# Patient Record
Sex: Male | Born: 1945 | Race: White | Hispanic: No | Marital: Married | Smoking: Former smoker
Health system: Southern US, Community
[De-identification: ages and names within clinical notes are randomized; demographics above are authoritative.]

## PROBLEM LIST (undated history)

## (undated) DIAGNOSIS — B029 Zoster without complications: Secondary | ICD-10-CM

## (undated) HISTORY — PX: VASECTOMY: SHX75

## (undated) HISTORY — DX: Zoster without complications: B02.9

---

## 2015-03-05 ENCOUNTER — Other Ambulatory Visit (HOSPITAL_BASED_OUTPATIENT_CLINIC_OR_DEPARTMENT_OTHER): Payer: Medicare HMO

## 2015-03-05 ENCOUNTER — Other Ambulatory Visit: Payer: Medicare HMO

## 2015-03-05 ENCOUNTER — Ambulatory Visit (HOSPITAL_BASED_OUTPATIENT_CLINIC_OR_DEPARTMENT_OTHER): Payer: Medicare HMO | Admitting: Hematology & Oncology

## 2015-03-05 ENCOUNTER — Ambulatory Visit: Payer: Medicare HMO

## 2015-03-05 ENCOUNTER — Ambulatory Visit: Payer: Medicare HMO | Admitting: Hematology & Oncology

## 2015-03-05 ENCOUNTER — Encounter: Payer: Self-pay | Admitting: Hematology & Oncology

## 2015-03-05 VITALS — BP 123/67 | HR 62 | Temp 98.1°F | Resp 18 | Ht 68.0 in | Wt 173.0 lb

## 2015-03-05 DIAGNOSIS — D7589 Other specified diseases of blood and blood-forming organs: Secondary | ICD-10-CM

## 2015-03-05 DIAGNOSIS — D539 Nutritional anemia, unspecified: Secondary | ICD-10-CM

## 2015-03-05 DIAGNOSIS — K8020 Calculus of gallbladder without cholecystitis without obstruction: Secondary | ICD-10-CM

## 2015-03-05 LAB — CBC WITH DIFFERENTIAL (CANCER CENTER ONLY)
BASO#: 0.1 10*3/uL (ref 0.0–0.2)
BASO%: 1.4 % (ref 0.0–2.0)
EOS%: 1.5 % (ref 0.0–7.0)
Eosinophils Absolute: 0.1 10*3/uL (ref 0.0–0.5)
HEMATOCRIT: 34.5 % — AB (ref 38.7–49.9)
HEMOGLOBIN: 11.4 g/dL — AB (ref 13.0–17.1)
LYMPH#: 2.4 10*3/uL (ref 0.9–3.3)
LYMPH%: 33.1 % (ref 14.0–48.0)
MCH: 37.7 pg — ABNORMAL HIGH (ref 28.0–33.4)
MCHC: 33 g/dL (ref 32.0–35.9)
MCV: 114 fL — ABNORMAL HIGH (ref 82–98)
MONO#: 0.5 10*3/uL (ref 0.1–0.9)
MONO%: 6.8 % (ref 0.0–13.0)
NEUT%: 57.2 % (ref 40.0–80.0)
NEUTROS ABS: 4.1 10*3/uL (ref 1.5–6.5)
Platelets: 213 10*3/uL (ref 145–400)
RBC: 3.02 10*6/uL — ABNORMAL LOW (ref 4.20–5.70)
RDW: 10.4 % — ABNORMAL LOW (ref 11.1–15.7)
WBC: 7.2 10*3/uL (ref 4.0–10.0)

## 2015-03-05 LAB — CHCC SATELLITE - SMEAR

## 2015-03-05 NOTE — Progress Notes (Signed)
Referral MD  Reason for Referral: Macrocytic anemia   Chief Complaint  Patient presents with  . OTHER    New Patient  : I want to make sure that I do not have Dothan's Disease  HPI: Duane Clarke is a very nice 69 year old white male. He is originally from [place]. He is residual from [facility].  He says that his brother died after having the flu. He says brother had a blood problem. He said that his brother had a have his spleen taken out area did after he had the spleen taken out, he came down the flu and died.  I have never heard of Dothan disease. I have looked this up. I'm not sure at all as to what exactly his brother had.  Duane Clarke has been feeling well. He works. He's had no fatigue or weakness. He's had no obvious bleeding. He's had no abdominal pain. He's had no cough. He's had no change in bowel or bladder habits.  His wife was worried about him having Dothan disease. As such, he is being referred to [place] for an evaluation.  He's had no issues with fever. He's had no infection process.  He does not know if he has any gallstones. He was told that at some point, they will have gallstones.  As far as he knows, no one else his family has any obvious blood problems.  Is no history of cancer in the family.  He is up-to-date with his colonoscopy and PSA.  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  Overall, his performance status is ECOG 0.   No past medical history on file.:  No past surgical history on file.:  No current outpatient prescriptions on file.:  :  Allergies  Allergen Reactions  . Aspirin Other (See Comments)    fatigue  :  No family history on file.:  Social History   Social History  . Marital Status: Married    Spouse Name: N/A  . Number of Children: N/A  . Years of Education: N/A   Occupational History  . Not on file.   Social History Main Topics  . Smoking status:  Former Games developer  . Smokeless tobacco: Not on file     Comment: Quit in 1977  . Alcohol Use: Not on file  . Drug Use: Not on file  . Sexual Activity: Not on file   Other Topics Concern  . Not on file   Social History Narrative  . No narrative on file  :  Pertinent items are noted in HPI.  Exam: @  well-developed and well-nourished white male in no obvious distress. Vital signs show a temperature of 98.1. Pulse 62. Blood pressure 123/67. Weight is 173 pounds. Head and neck exam shows no ocular or oral lesions. He does have a subconjunctival hemorrhage in the left eye. His pupils react properly. There is no adenopathy in the neck. Lungs are clear bilaterally. Cardiac exam regular rate and rhythm with a normal S1 and S2. There are no murmurs, rubs or bruits. Abdomen is soft. He has good bowel sounds. There is no fluid wave. There is no obvious hepatomegaly. Spleen tip might be palpable with deep inspiration. Back exam shows no tenderness over the spine, ribs or hips. Extremities shows no clubbing, cyanosis or edema. Neurological exam shows no focal neurological deficit. Skin exam shows no rashes, ecchymoses or petechia.    Recent Labs  03/05/15 1529  WBC 7.2  HGB 11.4*  HCT 34.5*  PLT 213   No results for input(s): NA, K, CL, CO2, GLUCOSE, BUN, CREATININE, CALCIUM in the last 72 hours.  Blood smear review: Slightly macrocytic population of red blood cells. There are some spherocytes. I see no nucleated red blood cells. He has no polychromasia. There is no target cells. He has no schistocytes. I see no inclusion bodies. White cells appear normal in morphology maturation. I do not see any immature myeloid or lymphoid forms. He has no hypersegmented polys. Platelets are adequate in number and size. He has well granulated platelets  Pathology: None     Assessment and Plan: Duane Clarke is a 69 year old gentleman. He has mild macrocytic anemia. His blood smear might be consistent with  spherocytosis. I just wonder if this is not what he has. I'm sending off a reticulocyte count on him.  I think that he probably needs to have a ultrasound of his abdomen. This will used IDS whether he does have gallstones and whether there is any splenomegaly. I think this would be important.  I would not think that this is vitamin B-12 deficiency. I really don't see any on the smear that looks like pernicious anemia.  I think that he probably does need folic acid. I will call in a prescription of folic acid to his pharmacy.  I spent about 45 minutes with him. Again, I have never heard of Dothan's disease. I have looked everywhere and cannot find any reference to Dothan's disease.  I would like to see him back in about 2 or 3 months. We will check his blood count then.  The other possibility that I thought about his myelodysplasia. This certainly would be more likely. He typically find macrocytic anemia with myelodysplasia. However, a bone marrow would be needed to make a diagnosis. I do not believe that this is indicated right now.  Again, I did a very nice time with him. He is very fun to talk to.

## 2015-03-06 LAB — RETICULOCYTES (CHCC)
ABS RETIC: 135.1 10*3/uL (ref 19.0–186.0)
RBC.: 3.07 MIL/uL — ABNORMAL LOW (ref 4.22–5.81)
RETIC CT PCT: 4.4 % — AB (ref 0.4–2.3)

## 2015-03-06 LAB — FOLATE RBC: RBC Folate: 1330 ng/mL (ref >280)

## 2015-03-06 LAB — VITAMIN B12: VITAMIN B 12: 444 pg/mL (ref 211–911)

## 2015-03-06 MED ORDER — FOLIC ACID 1 MG PO TABS: 1.0000 mg | ORAL_TABLET | Freq: Every day | ORAL | Status: DC

## 2015-03-06 NOTE — Addendum Note (Signed)
Addended by: Arlan Organ R on: 03/06/2015 08:57 AM   Modules accepted: Orders

## 2015-03-12 ENCOUNTER — Ambulatory Visit (HOSPITAL_BASED_OUTPATIENT_CLINIC_OR_DEPARTMENT_OTHER)
Admission: RE | Admit: 2015-03-12 | Discharge: 2015-03-12 | Disposition: A | Discharge status: Home / Self Care | Payer: Medicare HMO | Source: Ambulatory Visit | Attending: Hematology & Oncology | Admitting: Hematology & Oncology

## 2015-03-12 ENCOUNTER — Telehealth: Payer: Self-pay | Admitting: *Deleted

## 2015-03-12 DIAGNOSIS — K8020 Calculus of gallbladder without cholecystitis without obstruction: Secondary | ICD-10-CM | POA: Diagnosis not present

## 2015-03-12 DIAGNOSIS — D7589 Other specified diseases of blood and blood-forming organs: Secondary | ICD-10-CM

## 2015-03-12 DIAGNOSIS — D589 Hereditary hemolytic anemia, unspecified: Secondary | ICD-10-CM | POA: Diagnosis present

## 2015-03-12 NOTE — Telephone Encounter (Addendum)
Patient aware of results. Results sent to his PCP  ----- Message from Josph Macho, MD sent at 03/12/2015 11:17 AM EDT ----- Please call and tell him that the spleen is okay. He does have some gallstones. Thank you

## 2015-05-09 ENCOUNTER — Ambulatory Visit: Payer: Medicare HMO | Admitting: Hematology & Oncology

## 2015-05-09 ENCOUNTER — Other Ambulatory Visit: Payer: Medicare HMO

## 2016-02-23 ENCOUNTER — Other Ambulatory Visit: Payer: Self-pay | Admitting: Hematology & Oncology

## 2016-02-23 DIAGNOSIS — K8020 Calculus of gallbladder without cholecystitis without obstruction: Secondary | ICD-10-CM

## 2016-02-23 DIAGNOSIS — D7589 Other specified diseases of blood and blood-forming organs: Secondary | ICD-10-CM

## 2018-04-17 ENCOUNTER — Ambulatory Visit (INDEPENDENT_AMBULATORY_CARE_PROVIDER_SITE_OTHER): Payer: Medicare HMO | Admitting: Sports Medicine

## 2018-04-17 ENCOUNTER — Encounter: Payer: Self-pay | Admitting: Sports Medicine

## 2018-04-17 DIAGNOSIS — N400 Enlarged prostate without lower urinary tract symptoms: Secondary | ICD-10-CM | POA: Insufficient documentation

## 2018-04-17 DIAGNOSIS — Z0000 Encounter for general adult medical examination without abnormal findings: Secondary | ICD-10-CM | POA: Diagnosis not present

## 2018-04-17 DIAGNOSIS — N401 Enlarged prostate with lower urinary tract symptoms: Secondary | ICD-10-CM | POA: Diagnosis not present

## 2018-04-17 DIAGNOSIS — D531 Other megaloblastic anemias, not elsewhere classified: Secondary | ICD-10-CM

## 2018-04-17 DIAGNOSIS — R718 Other abnormality of red blood cells: Secondary | ICD-10-CM

## 2018-04-17 DIAGNOSIS — Z8619 Personal history of other infectious and parasitic diseases: Secondary | ICD-10-CM | POA: Diagnosis not present

## 2018-04-17 DIAGNOSIS — D581 Hereditary elliptocytosis: Secondary | ICD-10-CM

## 2018-04-17 MED ORDER — VALACYCLOVIR HCL 1 G PO TABS: 1000.0000 mg | ORAL_TABLET | Freq: Two times a day (BID) | ORAL | 2 refills | Status: DC

## 2018-04-17 NOTE — Assessment & Plan Note (Signed)
Adding a PSA. He does have a single episode of nocturia, weak stream and dribbling in the morning but not bad enough to consider medication.

## 2018-04-17 NOTE — Progress Notes (Addendum)
Subjective:    CC: New patient visit with the below complaints as noted in HPI:  HPI:  Sharl Ma is a pleasant 72 year old male, he is healthy, takes no medications with the exception of vitamins.  He declines all vaccines, understands the risks.  He is agreeable to have routine labs checked.  I reviewed the past medical history, family history, social history, surgical history, and allergies today and no changes were needed.  Please see the problem list section below in epic for further details.  Past Medical History: Past Medical History:  Diagnosis Date  . Shingles    Past Surgical History: Past Surgical History:  Procedure Laterality Date  . VASECTOMY     Social History: Social History   Socioeconomic History  . Marital status: Married    Spouse name: Not on file  . Number of children: Not on file  . Years of education: Not on file  . Highest education level: Not on file  Occupational History  . Not on file  Social Needs  . Financial resource strain: Not on file  . Food insecurity:    Worry: Not on file    Inability: Not on file  . Transportation needs:    Medical: Not on file    Non-medical: Not on file  Tobacco Use  . Smoking status: Former Games developer  . Smokeless tobacco: Never Used  . Tobacco comment: Quit in 1977  Substance and Sexual Activity  . Alcohol use: Never    Alcohol/week: 0.0 standard drinks    Frequency: Never  . Drug use: Never  . Sexual activity: Not on file  Lifestyle  . Physical activity:    Days per week: Not on file    Minutes per session: Not on file  . Stress: Not on file  Relationships  . Social connections:    Talks on phone: Not on file    Gets together: Not on file    Attends religious service: Not on file    Active member of club or organization: Not on file    Attends meetings of clubs or organizations: Not on file    Relationship status: Not on file  Other Topics Concern  . Not on file  Social History Narrative  . Not on  file   Family History: Family History  Problem Relation Age of Onset  . Cancer Father        prostate  . Alzheimer's disease Father   . Stroke Maternal Grandfather   . Stroke Paternal Grandfather    Allergies: Allergies  Allergen Reactions  . Aspirin Other (See Comments)    fatigue   Medications: See med rec.  Review of Systems: No headache, visual changes, nausea, vomiting, diarrhea, constipation, dizziness, abdominal pain, skin rash, fevers, chills, night sweats, swollen lymph nodes, weight loss, chest pain, body aches, joint swelling, muscle aches, shortness of breath, mood changes, visual or auditory hallucinations.  Objective:    General: Well Developed, well nourished, and in no acute distress.  Neuro: Alert and oriented x3, extra-ocular muscles intact, sensation grossly intact.  HEENT: Normocephalic, atraumatic, pupils equal round reactive to light, neck supple, no masses, no lymphadenopathy, thyroid nonpalpable.  Skin: Warm and dry, no rashes noted.  Cardiac: Regular rate and rhythm, no murmurs rubs or gallops.  Respiratory: Clear to auscultation bilaterally. Not using accessory muscles, speaking in full sentences.  Abdominal: Soft, nontender, nondistended, positive bowel sounds, no masses, no organomegaly.  Musculoskeletal: Shoulder, elbow, wrist, hip, knee, ankle stable, and with full range  of motion.  Impression and Recommendations:    The patient was counselled, risk factors were discussed, anticipatory guidance given.  BPH (benign prostatic hyperplasia) Adding a PSA. He does have a single episode of nocturia, weak stream and dribbling in the morning but not bad enough to consider medication.  Annual physical exam Adding hepatitis C screening. Declines all vaccines.  Risks, benefits explained.  Red blood cell abnormality Tells me he had a history of jaundice and abnormally shaped red blood cells, does not have any more information. Adding a CBC,  coags. Hemoglobin electrophoresis as well as a pathology smear. He also tells me that occasionally he will get jaundice when eating fatty foods, suggesting biliary obstruction. If this happens again he will come see me immediately.  History of shingles Declines all vaccines. I am going to give him some Valtrex should he develop symptoms of shingles.  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   Increased reticulocyte count, this indicates that there has been blood loss in the bone marrow is producing more red blood cells.   His elevated MCV can be explained by the reticulocytosis. Vitamin B12 is in the low limit of the normal range, I would like him to start oral vitamin B12 supplementation. I will call this in, 1000 mcg daily. In 1 month I would like to recheck his hemoglobin, anemia panels, as well as B12 levels. In addition because he is likely losing blood somewhere I would like him to touch base with gastroenterology. ___________________________________________ [name]. Benjamin Stain, M.D., ABFM., CAQSM. Primary Care and Sports Medicine [facility]  Adjunct Professor of Family Medicine  [employer]

## 2018-04-17 NOTE — Assessment & Plan Note (Signed)
Declines all vaccines. I am going to give him some Valtrex should he develop symptoms of shingles.

## 2018-04-17 NOTE — Assessment & Plan Note (Signed)
Tells me he had a history of jaundice and abnormally shaped red blood cells, does not have any more information. Adding a CBC, coags. Hemoglobin electrophoresis as well as a pathology smear. He also tells me that occasionally he will get jaundice when eating fatty foods, suggesting biliary obstruction. If this happens again he will come see me immediately.

## 2018-04-17 NOTE — Assessment & Plan Note (Addendum)
Adding hepatitis C screening. Declines all vaccines.  Risks, benefits explained.

## 2018-04-18 DIAGNOSIS — D531 Other megaloblastic anemias, not elsewhere classified: Secondary | ICD-10-CM | POA: Insufficient documentation

## 2018-04-18 LAB — CBC
HCT: 36 % — ABNORMAL LOW (ref 38.5–50.0)
Hemoglobin: 12.6 g/dL — ABNORMAL LOW (ref 13.2–17.1)
MCH: 37.5 pg — ABNORMAL HIGH (ref 27.0–33.0)
MCHC: 35 g/dL (ref 32.0–36.0)
MCV: 107.1 fL — ABNORMAL HIGH (ref 80.0–100.0)
MPV: 12.2 fL (ref 7.5–12.5)
Platelets: 236 10*3/uL (ref 140–400)
RBC: 3.36 Million/uL — ABNORMAL LOW (ref 4.20–5.80)
RDW: 10.3 % — ABNORMAL LOW (ref 11.0–15.0)
WBC: 8.4 10*3/uL (ref 3.8–10.8)

## 2018-04-18 NOTE — Assessment & Plan Note (Addendum)
Anemic with a very elevated MCV, has been high as 114 in the past as well, adding an anemia panel, please call lab to have these orders added.   Increased reticulocyte count, this indicates that there has been blood loss in the bone marrow is producing more red blood cells.   His elevated MCV can be explained by the reticulocytosis. Vitamin B12 is in the low limit of the normal range, I would like him to start oral vitamin B12 supplementation. I will call this in, 1000 mcg daily. In 1 month I would like to recheck his hemoglobin, anemia panels, as well as B12 levels. In addition because he is likely losing blood somewhere I would like him to touch base with gastroenterology.

## 2018-04-18 NOTE — Addendum Note (Signed)
Addended by: Monica Becton on: 04/18/2018 08:31 AM   Modules accepted: Orders

## 2018-04-20 MED ORDER — VITAMIN B-12 1000 MCG PO TABS: 1000.0000 ug | ORAL_TABLET | Freq: Every day | ORAL | 11 refills | Status: AC

## 2018-04-20 NOTE — Addendum Note (Signed)
Addended by: Monica Becton on: 04/20/2018 05:28 PM   Modules accepted: Orders

## 2018-04-21 LAB — ANEMIA PROFILE B
%SAT: 39 % (ref 20–48)
%SAT: 52 % (calc) — ABNORMAL HIGH (ref 20–48)
ABS Retic: 123950 cells/uL — ABNORMAL HIGH (ref 25000–9000)
ABS Retic: 144000 cells/uL — ABNORMAL HIGH (ref 25000–9000)
Basophils Absolute: 134 {cells}/uL (ref 0–200)
Basophils Absolute: 109 cells/uL (ref 0–200)
Basophils Relative: 1.6 %
Basophils Relative: 1.6 %
Eosinophils Absolute: 101 cells/uL (ref 15–500)
Eosinophils Absolute: 82 {cells}/uL (ref 15–500)
Eosinophils Relative: 1.2 %
Eosinophils Relative: 1.2 %
Ferritin: 328 ng/mL (ref 24–380)
Ferritin: 276 ng/mL (ref 24–380)
Folate: 16.4 ng/mL
Folate: 21.5 ng/mL
HCT: 36 % — ABNORMAL LOW (ref 38.5–50.0)
HCT: 34.8 % — ABNORMAL LOW (ref 38.5–50.0)
Hemoglobin: 12.6 g/dL — ABNORMAL LOW (ref 13.2–17.1)
Hemoglobin: 12 g/dL — ABNORMAL LOW (ref 13.2–17.1)
Iron: 119 ug/dL (ref 50–180)
Iron: 156 ug/dL (ref 50–180)
Lymphs Abs: 3503 {cells}/uL (ref 850–3900)
Lymphs Abs: 2482 cells/uL (ref 850–3900)
MCH: 37.5 pg — ABNORMAL HIGH (ref 27.0–33.0)
MCH: 37.5 pg — ABNORMAL HIGH (ref 27.0–33.0)
MCHC: 35 g/dL (ref 32.0–36.0)
MCHC: 34.5 g/dL (ref 32.0–36.0)
MCV: 107.1 fL — ABNORMAL HIGH (ref 80.0–100.0)
MCV: 108.8 fL — ABNORMAL HIGH (ref 80.0–100.0)
MPV: 12.2 fL (ref 7.5–12.5)
MPV: 12.2 fL (ref 7.5–12.5)
Monocytes Relative: 6.3 %
Monocytes Relative: 4.3 %
Neutro Abs: 4133 cells/uL (ref 1500–7800)
Neutro Abs: 3835 cells/uL (ref 1500–7800)
Neutrophils Relative %: 49.2 %
Neutrophils Relative %: 56.4 %
Platelets: 236 Thousand/uL (ref 140–400)
Platelets: 226 Thousand/uL (ref 140–400)
RBC: 3.36 10*6/uL — ABNORMAL LOW (ref 4.20–5.80)
RBC: 3.2 Million/uL — ABNORMAL LOW (ref 4.20–5.80)
RDW: 10.3 % — ABNORMAL LOW (ref 11.0–15.0)
RDW: 9.9 % — ABNORMAL LOW (ref 11.0–15.0)
Retic Ct Pct: 3.7 %
Retic Ct Pct: 4.5 %
TIBC: 307 mcg/dL (calc) (ref 250–425)
TIBC: 299 mcg/dL (calc) (ref 250–425)
Total Lymphocyte: 41.7 %
Total Lymphocyte: 36.5 %
Vitamin B-12: 291 pg/mL (ref 200–1100)
Vitamin B-12: 324 pg/mL (ref 200–1100)
WBC mixed population: 529 {cells}/uL (ref 200–950)
WBC mixed population: 292 cells/uL (ref 200–950)
WBC: 8.4 10*3/uL (ref 3.8–10.8)
WBC: 6.8 10*3/uL (ref 3.8–10.8)

## 2018-04-21 LAB — TEST AUTHORIZATION 3

## 2018-04-21 LAB — TEST AUTHORIZATION

## 2018-04-21 LAB — COMPREHENSIVE METABOLIC PANEL WITH GFR
AG Ratio: 2.3 (calc) (ref 1.0–2.5)
AST: 18 U/L (ref 10–35)
CO2: 28 mmol/L (ref 20–32)
Calcium: 9.4 mg/dL (ref 8.6–10.3)
Chloride: 105 mmol/L (ref 98–110)
Globulin: 2 g/dL (ref 1.9–3.7)
Glucose, Bld: 90 mg/dL (ref 65–99)
Sodium: 140 mmol/L (ref 135–146)
Total Protein: 6.5 g/dL (ref 6.1–8.1)

## 2018-04-21 LAB — HEMOGLOBINOPATHY EVALUATION
Fetal Hemoglobin Testing: <1 % (ref 0.0–1.9)
HCT: 36.2 % — ABNORMAL LOW (ref 38.5–50.0)
Hemoglobin A2 - HGBRFX: 2.1 % (ref 1.8–3.5)
Hemoglobin: 12.4 g/dL — ABNORMAL LOW (ref 13.2–17.1)
Hgb A: 96.9 % (ref >96.0)
MCH: 37.1 pg — ABNORMAL HIGH (ref 27.0–33.0)
MCV: 108.4 fL — ABNORMAL HIGH (ref 80.0–100.0)
RBC: 3.34 Million/uL — ABNORMAL LOW (ref 4.20–5.80)
RDW: 10.1 % — ABNORMAL LOW (ref 11.0–15.0)

## 2018-04-21 LAB — PSA, TOTAL AND FREE
PSA, % Free: 33 % (calc) (ref >25)
PSA, Free: 0.1 ng/mL
PSA, Total: 0.3 ng/mL (ref <=4.0)

## 2018-04-21 LAB — LIPID PANEL W/REFLEX DIRECT LDL
Cholesterol: 149 mg/dL (ref <200)
HDL: 40 mg/dL — ABNORMAL LOW (ref >40)
LDL Cholesterol (Calc): 90 mg/dL
Non-HDL Cholesterol (Calc): 109 mg/dL (calc) (ref <130)
Total CHOL/HDL Ratio: 3.7 (calc) (ref <5.0)
Triglycerides: 94 mg/dL (ref <150)

## 2018-04-21 LAB — HEPATITIS C ANTIBODY
Hepatitis C Ab: NONREACTIVE
SIGNAL TO CUT-OFF: 0.01 (ref <1.00)

## 2018-04-21 LAB — COMPREHENSIVE METABOLIC PANEL
ALT: 16 U/L (ref 9–46)
Albumin: 4.5 g/dL (ref 3.6–5.1)
Alkaline phosphatase (APISO): 58 U/L (ref 40–115)
BUN: 13 mg/dL (ref 7–25)
Creat: 0.9 mg/dL (ref 0.70–1.18)
Potassium: 4 mmol/L (ref 3.5–5.3)
Total Bilirubin: 2.8 mg/dL — ABNORMAL HIGH (ref 0.2–1.2)

## 2018-04-21 LAB — PATHOLOGIST SMEAR REVIEW

## 2018-04-21 LAB — HEMOGLOBIN A1C: Hgb A1c MFr Bld: <4 % of total Hgb (ref <5.7)

## 2018-04-21 LAB — TSH: TSH: 1.44 m[IU]/L (ref 0.40–4.50)

## 2018-05-02 ENCOUNTER — Encounter: Payer: Self-pay | Admitting: Sports Medicine

## 2018-05-02 ENCOUNTER — Ambulatory Visit (INDEPENDENT_AMBULATORY_CARE_PROVIDER_SITE_OTHER): Payer: Medicare HMO | Admitting: Sports Medicine

## 2018-05-02 DIAGNOSIS — D531 Other megaloblastic anemias, not elsewhere classified: Secondary | ICD-10-CM | POA: Diagnosis not present

## 2018-05-02 NOTE — Assessment & Plan Note (Signed)
Megaloblastic anemia, vitamin B12 is low, we have started supplementation. 1000 micro grams daily. Return in 1 month to recheck hemoglobin and anemia indices. He has described some form of elliptocytosis which we did not see on the peripheral smear, his son will have a letter drafted to the insurance company that should lower his policy.

## 2018-05-02 NOTE — Progress Notes (Signed)
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  He just wanted to go over his lab results, he does have a life insurance policy, because of what he described as an elliptocytosis versus spherocytosis his rates are expensive, because we did a peripheral smear, and no bite cells, spherocytes or elliptocytes were seen he would like me to write a letter to his insurance company.  I reviewed the past medical history, family history, social history, surgical history, and allergies today and no changes were needed.  Please see the problem list section below in epic for further details.  Past Medical History: Past Medical History:  Diagnosis Date  . Shingles    Past Surgical History: Past Surgical History:  Procedure Laterality Date  . VASECTOMY     Social History: Social History   Socioeconomic History  . Marital status: Married    Spouse name: Not on file  . Number of children: Not on file  . Years of education: Not on file  . Highest education level: Not on file  Occupational History  . Not on file  Social Needs  . Financial resource strain: Not on file  . Food insecurity:    Worry: Not on file    Inability: Not on file  . Transportation needs:    Medical: Not on file    Non-medical: Not on file  Tobacco Use  . Smoking status: Former Games developer  . Smokeless tobacco: Never Used  . Tobacco comment: Quit in 1977  Substance and Sexual Activity  . Alcohol use: Never    Alcohol/week: 0.0 standard drinks    Frequency: Never  . Drug use: Never  . Sexual activity: Not on file  Lifestyle  . Physical activity:    Days per week: Not on file    Minutes per session: Not on file  . Stress: Not on file  Relationships  . Social connections:    Talks on phone: Not on file    Gets together: Not on  file    Attends religious service: Not on file    Active member of club or organization: Not on file    Attends meetings of clubs or organizations: Not on file    Relationship status: Not on file  Other Topics Concern  . Not on file  Social History Narrative  . Not on file   Family History: Family History  Problem Relation Age of Onset  . Cancer Father        prostate  . Alzheimer's disease Father   . Stroke Maternal Grandfather   . Stroke Paternal Grandfather    Allergies: Allergies  Allergen Reactions  . Aspirin Other (See Comments)    fatigue   Medications: See med rec.  Review of Systems: No fevers, chills, night sweats, weight loss, chest pain, or shortness of breath.   Objective:    General: Well Developed, well nourished, and in no acute distress.  Neuro: Alert and oriented x3, extra-ocular muscles intact, sensation grossly intact.  HEENT: Normocephalic, atraumatic, pupils equal round reactive to light, neck supple, no masses, no lymphadenopathy, thyroid nonpalpable.  Skin: Warm and dry, no rashes. Cardiac: Regular rate and rhythm, no murmurs rubs or gallops, no lower extremity edema.  Respiratory: Clear to auscultation bilaterally. Not using accessory muscles, speaking in  full sentences.  Impression and Recommendations:    Megaloblastic anemia Megaloblastic anemia, vitamin B12 is low, we have started supplementation. 1000 micro grams daily. Return in 1 month to recheck hemoglobin and anemia indices. He has described some form of elliptocytosis which we did not see on the peripheral smear, his son will have a letter drafted to the insurance company that should lower his policy. ___________________________________________ [name]. Benjamin Stain, M.D., ABFM., CAQSM. Primary Care and Sports Medicine [facility]  Adjunct Professor of Family Medicine  [employer]

## 2018-05-12 ENCOUNTER — Encounter: Payer: Self-pay | Admitting: Sports Medicine

## 2018-05-12 ENCOUNTER — Ambulatory Visit (INDEPENDENT_AMBULATORY_CARE_PROVIDER_SITE_OTHER): Payer: Medicare HMO | Admitting: Sports Medicine

## 2018-05-12 DIAGNOSIS — S01111D Laceration without foreign body of right eyelid and periocular area, subsequent encounter: Secondary | ICD-10-CM | POA: Diagnosis not present

## 2018-05-12 DIAGNOSIS — S0181XD Laceration without foreign body of other part of head, subsequent encounter: Secondary | ICD-10-CM | POA: Diagnosis not present

## 2018-05-12 DIAGNOSIS — S0181XA Laceration without foreign body of other part of head, initial encounter: Secondary | ICD-10-CM

## 2018-05-12 DIAGNOSIS — S01119A Laceration without foreign body of unspecified eyelid and periocular area, initial encounter: Secondary | ICD-10-CM | POA: Insufficient documentation

## 2018-05-12 NOTE — Progress Notes (Signed)
Subjective:    CC: Laceration  HPI: Duane Clarke fell up the stairs, hit his head and sustained a large laceration on his right forehead.  This was repaired in the emergency department, he is here for further evaluation.  No loss of consciousness, minimal pain, sutures have been in for 1 week now.  I reviewed the past medical history, family history, social history, surgical history, and allergies today and no changes were needed.  Please see the problem list section below in epic for further details.  Past Medical History: Past Medical History:  Diagnosis Date  . Shingles    Past Surgical History: Past Surgical History:  Procedure Laterality Date  . VASECTOMY     Social History: Social History   Socioeconomic History  . Marital status: Married    Spouse name: Not on file  . Number of children: Not on file  . Years of education: Not on file  . Highest education level: Not on file  Occupational History  . Not on file  Social Needs  . Financial resource strain: Not on file  . Food insecurity:    Worry: Not on file    Inability: Not on file  . Transportation needs:    Medical: Not on file    Non-medical: Not on file  Tobacco Use  . Smoking status: Former Games developermoker  . Smokeless tobacco: Never Used  . Tobacco comment: Quit in 1977  Substance and Sexual Activity  . Alcohol use: Never    Alcohol/week: 0.0 standard drinks    Frequency: Never  . Drug use: Never  . Sexual activity: Not on file  Lifestyle  . Physical activity:    Days per week: Not on file    Minutes per session: Not on file  . Stress: Not on file  Relationships  . Social connections:    Talks on phone: Not on file    Gets together: Not on file    Attends religious service: Not on file    Active member of club or organization: Not on file    Attends meetings of clubs or organizations: Not on file    Relationship status: Not on file  Other Topics Concern  . Not on file  Social History Narrative  . Not on  file   Family History: Family History  Problem Relation Age of Onset  . Cancer Father        prostate  . Alzheimer's disease Father   . Stroke Maternal Grandfather   . Stroke Paternal Grandfather    Allergies: Allergies  Allergen Reactions  . Aspirin Other (See Comments)    fatigue   Medications: See med rec.  Review of Systems: No fevers, chills, night sweats, weight loss, chest pain, or shortness of breath.   Objective:    General: Well Developed, well nourished, and in no acute distress.  Neuro: Alert and oriented x3, extra-ocular muscles intact, sensation grossly intact.  HEENT: Normocephalic, atraumatic, pupils equal round reactive to light, neck supple, no masses, no lymphadenopathy, thyroid nonpalpable.  Skin: Warm and dry, no rashes. Cardiac: Regular rate and rhythm, no murmurs rubs or gallops, no lower extremity edema.  Respiratory: Clear to auscultation bilaterally. Not using accessory muscles, speaking in full sentences. Forehead: There is a laceration from the hairline to the upper right eyebrow, running suture visible, Prolene.  This was removed in 3 parts, Dermabond applied to reinforce the wound edges.  No signs of bacterial superinfection.  Impression and Recommendations:    Laceration of eyebrow  and forehead Running suture removed, Dermabond applied. He has good function to his levator palpebrae and frontalis muscles. Return in a month for a final wound check. ___________________________________________ [name]. Benjamin Stain, M.D., ABFM., CAQSM. Primary Care and Sports Medicine [facility]  Adjunct Professor of Family Medicine  [employer]

## 2018-05-12 NOTE — Assessment & Plan Note (Signed)
Running suture removed, Dermabond applied. He has good function to his levator palpebrae and frontalis muscles. Return in a month for a final wound check.

## 2018-06-01 ENCOUNTER — Ambulatory Visit: Payer: Medicare HMO | Admitting: Sports Medicine

## 2018-06-12 ENCOUNTER — Ambulatory Visit (INDEPENDENT_AMBULATORY_CARE_PROVIDER_SITE_OTHER): Payer: Medicare HMO | Admitting: Sports Medicine

## 2018-06-12 ENCOUNTER — Encounter: Payer: Self-pay | Admitting: Sports Medicine

## 2018-06-12 ENCOUNTER — Ambulatory Visit (INDEPENDENT_AMBULATORY_CARE_PROVIDER_SITE_OTHER): Payer: Medicare HMO

## 2018-06-12 DIAGNOSIS — M7541 Impingement syndrome of right shoulder: Secondary | ICD-10-CM | POA: Diagnosis not present

## 2018-06-12 DIAGNOSIS — D531 Other megaloblastic anemias, not elsewhere classified: Secondary | ICD-10-CM

## 2018-06-12 DIAGNOSIS — M25511 Pain in right shoulder: Secondary | ICD-10-CM | POA: Diagnosis not present

## 2018-06-12 DIAGNOSIS — S0181XD Laceration without foreign body of other part of head, subsequent encounter: Secondary | ICD-10-CM

## 2018-06-12 DIAGNOSIS — G8929 Other chronic pain: Secondary | ICD-10-CM | POA: Diagnosis not present

## 2018-06-12 DIAGNOSIS — R5383 Other fatigue: Secondary | ICD-10-CM | POA: Diagnosis not present

## 2018-06-12 DIAGNOSIS — S01111D Laceration without foreign body of right eyelid and periocular area, subsequent encounter: Secondary | ICD-10-CM

## 2018-06-12 NOTE — Assessment & Plan Note (Signed)
1 month post laceration, healing well, he does have a bit of numbness on the forehead as expected, this can take a year plus to resolve. I do think there was a laceration into the frontalis, he will continue to massage the area and work on raising his eyebrows.

## 2018-06-12 NOTE — Assessment & Plan Note (Signed)
Nonspecific, may be due to old age, we will recheck his iron indices, if normalized we will start another work-up for fatigue.

## 2018-06-12 NOTE — Progress Notes (Signed)
Subjective:    CC: Multiple issues  HPI: Laceration: Healed.  Right shoulder pain: Localized over the deltoid, worse with overhead activities, moderate, persistent, localized radiation.  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  He did have a visit with hematology, they also suspect this simply megaloblastic anemia without any evidence of spherocytosis or elliptocytosis, or hemolytic anemia.  I reviewed the past medical history, family history, social history, surgical history, and allergies today and no changes were needed.  Please see the problem list section below in epic for further details.  Past Medical History: Past Medical History:  Diagnosis Date  . Shingles    Past Surgical History: Past Surgical History:  Procedure Laterality Date  . VASECTOMY     Social History: Social History   Socioeconomic History  . Marital status: Married    Spouse name: Not on file  . Number of children: Not on file  . Years of education: Not on file  . Highest education level: Not on file  Occupational History  . Not on file  Social Needs  . Financial resource strain: Not on file  . Food insecurity:    Worry: Not on file    Inability: Not on file  . Transportation needs:    Medical: Not on file    Non-medical: Not on file  Tobacco Use  . Smoking status: Former Games developermoker  . Smokeless tobacco: Never Used  . Tobacco comment: Quit in 1977  Substance and Sexual Activity  . Alcohol use: Never    Alcohol/week: 0.0 standard drinks    Frequency: Never  . Drug use: Never  . Sexual activity: Not on file  Lifestyle  . Physical activity:    Days per week: Not on file    Minutes per session: Not on file  . Stress: Not on file  Relationships  . Social connections:    Talks on phone: Not on file    Gets together: Not on file    Attends religious service: Not on file    Active member of club or organization: Not on file    Attends meetings of clubs or  organizations: Not on file    Relationship status: Not on file  Other Topics Concern  . Not on file  Social History Narrative  . Not on file   Family History: Family History  Problem Relation Age of Onset  . Cancer Father        prostate  . Alzheimer's disease Father   . Stroke Maternal Grandfather   . Stroke Paternal Grandfather    Allergies: Allergies  Allergen Reactions  . Aspirin Other (See Comments)    fatigue   Medications: See med rec.  Review of Systems: No fevers, chills, night sweats, weight loss, chest pain, or shortness of breath.   Objective:    General: Well Developed, well nourished, and in no acute distress.  Neuro: Alert and oriented x3, extra-ocular muscles intact, sensation grossly intact.  HEENT: Normocephalic, atraumatic, pupils equal round reactive to light, neck supple, no masses, no lymphadenopathy, thyroid nonpalpable.  Skin: Warm and dry, no rashes. Cardiac: Regular rate and rhythm, no murmurs rubs or gallops, no lower extremity edema.  Respiratory: Clear to auscultation bilaterally. Not using accessory muscles, speaking in full sentences. Right shoulder: Inspection reveals no abnormalities, atrophy or asymmetry. Palpation is normal with no tenderness over AC joint or bicipital groove. ROM is full in all planes. Rotator cuff strength normal throughout. Positive Neer and Hawkin's tests, empty can. Speeds and  Yergason's tests normal. No labral pathology noted with negative Obrien's, negative crank, negative clunk, and good stability. Normal scapular function observed. No painful arc and no drop arm sign. No apprehension sign  Procedure: Real-time Ultrasound Guided Injection of right subacromial bursa Device: GE Logiq E  Verbal informed consent obtained.  Time-out conducted.  Noted no overlying erythema, induration, or other signs of local infection.  Skin prepped in a sterile fashion.  Local anesthesia: Topical Ethyl chloride.  With sterile  technique and under real time ultrasound guidance: 1 cc Kenalog 40, 1 cc lidocaine, 1 cc bupivacaine injected easily Completed without difficulty  Pain immediately resolved suggesting accurate placement of the medication.  Advised to call if fevers/chills, erythema, induration, drainage, or persistent bleeding.  Images permanently stored and available for review in the ultrasound unit.  Impression: Technically successful ultrasound guided injection.  Impression and Recommendations:    Impingement syndrome, shoulder, right Injection as above per patient request, rehab exercises given, baseline x-rays obtained. Return in 6 weeks.  Megaloblastic anemia Continue B12 supplementation, peripheral smear did not show any evidence of elliptocytosis. He did see hematology, peripheral smear with hematology also was for the most part unremarkable with the exception of megaloblastic anemia. Rechecking B12, folate, CBC.  Laceration of eyebrow and forehead 1 month post laceration, healing well, he does have a bit of numbness on the forehead as expected, this can take a year plus to resolve. I do think there was a laceration into the frontalis, he will continue to massage the area and work on raising his eyebrows.  Fatigue Nonspecific, may be due to old age, we will recheck his iron indices, if normalized we will start another work-up for fatigue.  I spent 40 minutes with this patient, greater than 50% was face-to-face time counseling regarding the above diagnoses, this was separate from the time spent performing the above procedure and was spent discussing the multiple differentials for fatigue. ___________________________________________ [name]. Benjamin Stain, M.D., ABFM., CAQSM. Primary Care and Sports Medicine [facility]  Adjunct Professor of Family Medicine  [employer]

## 2018-06-12 NOTE — Assessment & Plan Note (Signed)
Continue B12 supplementation, peripheral smear did not show any evidence of elliptocytosis. He did see hematology, peripheral smear with hematology also was for the most part unremarkable with the exception of megaloblastic anemia. Rechecking B12, folate, CBC.

## 2018-06-12 NOTE — Assessment & Plan Note (Signed)
Injection as above per patient request, rehab exercises given, baseline x-rays obtained. Return in 6 weeks.

## 2018-06-13 LAB — RETICULOCYTES
ABS Retic: 142270 cells/uL — ABNORMAL HIGH (ref 25000–9000)
Retic Ct Pct: 4.1 %

## 2018-06-13 LAB — CBC WITH DIFFERENTIAL/PLATELET
Absolute Monocytes: 402 cells/uL (ref 200–950)
Basophils Absolute: 102 cells/uL (ref 0–200)
Basophils Relative: 1.7 %
Eosinophils Absolute: 102 {cells}/uL (ref 15–500)
Eosinophils Relative: 1.7 %
HCT: 38 % — ABNORMAL LOW (ref 38.5–50.0)
Hemoglobin: 12.8 g/dL — ABNORMAL LOW (ref 13.2–17.1)
Lymphs Abs: 2112 cells/uL (ref 850–3900)
MCH: 36.9 pg — ABNORMAL HIGH (ref 27.0–33.0)
MCHC: 33.7 g/dL (ref 32.0–36.0)
MCV: 109.5 fL — ABNORMAL HIGH (ref 80.0–100.0)
MPV: 11.9 fL (ref 7.5–12.5)
Monocytes Relative: 6.7 %
Neutro Abs: 3282 cells/uL (ref 1500–7800)
Neutrophils Relative %: 54.7 %
Platelets: 238 10*3/uL (ref 140–400)
RBC: 3.47 10*6/uL — ABNORMAL LOW (ref 4.20–5.80)
RDW: 10.1 % — ABNORMAL LOW (ref 11.0–15.0)
Total Lymphocyte: 35.2 %
WBC: 6 10*3/uL (ref 3.8–10.8)

## 2018-06-13 LAB — FOLATE: Folate: >24 ng/mL

## 2018-06-13 LAB — IRON, TOTAL/TOTAL IRON BINDING CAP
%SAT: 64 % (calc) — ABNORMAL HIGH (ref 20–48)
Iron: 203 ug/dL — ABNORMAL HIGH (ref 50–180)
TIBC: 319 mcg/dL (calc) (ref 250–425)

## 2018-06-13 LAB — VITAMIN B12: Vitamin B-12: 555 pg/mL (ref 200–1100)

## 2018-06-13 LAB — FERRITIN: Ferritin: 309 ng/mL (ref 24–380)

## 2018-07-24 ENCOUNTER — Ambulatory Visit (INDEPENDENT_AMBULATORY_CARE_PROVIDER_SITE_OTHER): Payer: Medicare Other | Admitting: Sports Medicine

## 2018-07-24 ENCOUNTER — Encounter: Payer: Self-pay | Admitting: Sports Medicine

## 2018-07-24 DIAGNOSIS — M19011 Primary osteoarthritis, right shoulder: Secondary | ICD-10-CM | POA: Diagnosis not present

## 2018-07-24 NOTE — Progress Notes (Addendum)
Subjective:    CC: Recheck shoulder  HPI: Duane Clarke returns, he is a pleasant 73 year old male.  We did a subacromial injection at the last visit, he did extremely well and all subacromial impingement symptoms resolved.  More recently he was digging up his septic tank, while advancing the shovel he felt a pain in his anterior right shoulder that has now been persistent, localized anteriorly and posteriorly without radiation, not significantly worse with overhead activities, no mechanical symptoms.  I reviewed the past medical history, family history, social history, surgical history, and allergies today and no changes were needed.  Please see the problem list section below in epic for further details.  Past Medical History: Past Medical History:  Diagnosis Date  . Shingles    Past Surgical History: Past Surgical History:  Procedure Laterality Date  . VASECTOMY     Social History: Social History   Socioeconomic History  . Marital status: Married    Spouse name: Not on file  . Number of children: Not on file  . Years of education: Not on file  . Highest education level: Not on file  Occupational History  . Not on file  Social Needs  . Financial resource strain: Not on file  . Food insecurity:    Worry: Not on file    Inability: Not on file  . Transportation needs:    Medical: Not on file    Non-medical: Not on file  Tobacco Use  . Smoking status: Former Games developer  . Smokeless tobacco: Never Used  . Tobacco comment: Quit in 1977  Substance and Sexual Activity  . Alcohol use: Never    Alcohol/week: 0.0 standard drinks    Frequency: Never  . Drug use: Never  . Sexual activity: Not on file  Lifestyle  . Physical activity:    Days per week: Not on file    Minutes per session: Not on file  . Stress: Not on file  Relationships  . Social connections:    Talks on phone: Not on file    Gets together: Not on file    Attends religious service: Not on file    Active member of  club or organization: Not on file    Attends meetings of clubs or organizations: Not on file    Relationship status: Not on file  Other Topics Concern  . Not on file  Social History Narrative  . Not on file   Family History: Family History  Problem Relation Age of Onset  . Cancer Father        prostate  . Alzheimer's disease Father   . Stroke Maternal Grandfather   . Stroke Paternal Grandfather    Allergies: Allergies  Allergen Reactions  . Aspirin Other (See Comments)    fatigue   Medications: See med rec.  Review of Systems: No fevers, chills, night sweats, weight loss, chest pain, or shortness of breath.   Objective:    General: Well Developed, well nourished, and in no acute distress.  Neuro: Alert and oriented x3, extra-ocular muscles intact, sensation grossly intact.  HEENT: Normocephalic, atraumatic, pupils equal round reactive to light, neck supple, no masses, no lymphadenopathy, thyroid nonpalpable.  Skin: Warm and dry, no rashes. Cardiac: Regular rate and rhythm, no murmurs rubs or gallops, no lower extremity edema.  Respiratory: Clear to auscultation bilaterally. Not using accessory muscles, speaking in full sentences. Right shoulder: Inspection reveals no abnormalities, atrophy or asymmetry. Palpation is normal with no tenderness over AC joint or bicipital groove.  ROM is full in all planes. Rotator cuff strength normal throughout. No signs of impingement with negative Neer and Hawkin's tests, empty can. Speeds and Yergason's tests normal. Positive Obrien's, positive crank, positive clunk, and good stability. Normal scapular function observed. No painful arc and no drop arm sign. No apprehension sign  Procedure: Real-time Ultrasound Guided Injection of right glenohumeral joint Device: GE Logiq E  Verbal informed consent obtained.  Time-out conducted.  Noted no overlying erythema, induration, or other signs of local infection.  Skin prepped in a sterile  fashion.  Local anesthesia: Topical Ethyl chloride.  With sterile technique and under real time ultrasound guidance: Using a posterior approach I advanced into the joint and injected 1 cc Kenalog 40, 2 cc lidocaine, 2 cc bupivacaine. Completed without difficulty  Pain immediately resolved suggesting accurate placement of the medication.  Advised to call if fevers/chills, erythema, induration, drainage, or persistent bleeding.  Images permanently stored and available for review in the ultrasound unit.  Impression: Technically successful ultrasound guided injection.  Impression and Recommendations:    Primary osteoarthritis, right shoulder Pain today is glenohumeral. Glenohumeral injection today provided good relief. Return in a month to reevaluate.  Adding topical nitroglycerin, he does not desire to have any surgery. Certainly we could try oral pain medications, he does have a massive rotator cuff tear. ___________________________________________ [name]. Benjamin Stain, M.D., ABFM., CAQSM. Primary Care and Sports Medicine [facility]  Adjunct Professor of Family Medicine  [employer]

## 2018-07-24 NOTE — Assessment & Plan Note (Addendum)
Pain today is glenohumeral. Glenohumeral injection today provided good relief. Return in a month to reevaluate.  Adding topical nitroglycerin, he does not desire to have any surgery. Certainly we could try oral pain medications, he does have a massive rotator cuff tear.

## 2018-08-08 ENCOUNTER — Encounter: Payer: Self-pay | Admitting: Sports Medicine

## 2018-09-19 ENCOUNTER — Telehealth: Payer: Self-pay | Admitting: *Deleted

## 2018-09-19 DIAGNOSIS — M19011 Primary osteoarthritis, right shoulder: Secondary | ICD-10-CM

## 2018-09-19 MED ORDER — TRAMADOL HCL 50 MG PO TABS: 50.0000 mg | ORAL_TABLET | Freq: Three times a day (TID) | ORAL | 0 refills | Status: DC | PRN

## 2018-09-19 NOTE — Telephone Encounter (Signed)
Pt left vm stating that he is starting to have a lot of right shoulder pain that is causing him to lose sleep. He wanted to know if he needed another injection or maybe something for pain but it's too soon for another injection.  Please advise.

## 2018-09-19 NOTE — Telephone Encounter (Signed)
He needs an MRI and surgery, I am ordering the MRI, adding tramadol.

## 2018-09-20 NOTE — Telephone Encounter (Signed)
LMOM notifying pt of rx & MRI order.

## 2018-09-25 ENCOUNTER — Other Ambulatory Visit: Payer: Self-pay

## 2018-09-25 ENCOUNTER — Ambulatory Visit (INDEPENDENT_AMBULATORY_CARE_PROVIDER_SITE_OTHER): Payer: Medicare Other

## 2018-09-25 DIAGNOSIS — M19011 Primary osteoarthritis, right shoulder: Secondary | ICD-10-CM

## 2018-09-27 MED ORDER — NITROGLYCERIN 0.2 MG/HR TD PT24: MEDICATED_PATCH | TRANSDERMAL | 11 refills | Status: DC

## 2018-09-27 NOTE — Addendum Note (Signed)
Addended by: Monica Becton on: 09/27/2018 11:26 AM   Modules accepted: Orders

## 2018-11-22 ENCOUNTER — Encounter: Payer: Self-pay | Admitting: Sports Medicine

## 2018-11-22 ENCOUNTER — Ambulatory Visit: Payer: Medicare Other | Admitting: Sports Medicine

## 2018-11-22 DIAGNOSIS — M19011 Primary osteoarthritis, right shoulder: Secondary | ICD-10-CM

## 2018-11-22 DIAGNOSIS — Z0000 Encounter for general adult medical examination without abnormal findings: Secondary | ICD-10-CM

## 2018-11-22 DIAGNOSIS — M7021 Olecranon bursitis, right elbow: Secondary | ICD-10-CM | POA: Diagnosis not present

## 2018-11-22 NOTE — Assessment & Plan Note (Signed)
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 Referral to Dr. Everardo Pacific to discuss reverse arthroplasty. Desires to avoid medications.

## 2018-11-22 NOTE — Progress Notes (Signed)
Subjective:    CC: Right elbow swelling  HPI: Duane Clarke is a pleasant 73 year old male, he has rotator cuff tear arthropathy, we have done a glenohumeral joint injection back in January that provided good but temporary relief.  Ultimately an MRI showed full-thickness/massive rotator cuff tear, as well as degenerative changes.  He is having recurrence of pain, at this point he is agreeable to discuss reverse arthroplasty with shoulder surgery.  In addition he developed some swelling over the back of his right elbow, moderate, persistent, localized without radiation.  He is due for some preventive measures.  I reviewed the past medical history, family history, social history, surgical history, and allergies today and no changes were needed.  Please see the problem list section below in epic for further details.  Past Medical History: Past Medical History:  Diagnosis Date  . Shingles    Past Surgical History: Past Surgical History:  Procedure Laterality Date  . VASECTOMY     Social History: Social History   Socioeconomic History  . Marital status: Married    Spouse name: Not on file  . Number of children: Not on file  . Years of education: Not on file  . Highest education level: Not on file  Occupational History  . Not on file  Social Needs  . Financial resource strain: Not on file  . Food insecurity:    Worry: Not on file    Inability: Not on file  . Transportation needs:    Medical: Not on file    Non-medical: Not on file  Tobacco Use  . Smoking status: Former Games developer  . Smokeless tobacco: Never Used  . Tobacco comment: Quit in 1977  Substance and Sexual Activity  . Alcohol use: Never    Alcohol/week: 0.0 standard drinks    Frequency: Never  . Drug use: Never  . Sexual activity: Not on file  Lifestyle  . Physical activity:    Days per week: Not on file    Minutes per session: Not on file  . Stress: Not on file  Relationships  . Social connections:    Talks on  phone: Not on file    Gets together: Not on file    Attends religious service: Not on file    Active member of club or organization: Not on file    Attends meetings of clubs or organizations: Not on file    Relationship status: Not on file  Other Topics Concern  . Not on file  Social History Narrative  . Not on file   Family History: Family History  Problem Relation Age of Onset  . Cancer Father        prostate  . Alzheimer's disease Father   . Stroke Maternal Grandfather   . Stroke Paternal Grandfather    Allergies: Allergies  Allergen Reactions  . Aspirin Other (See Comments)    fatigue   Medications: See med rec.  Review of Systems: No fevers, chills, night sweats, weight loss, chest pain, or shortness of breath.   Objective:    General: Well Developed, well nourished, and in no acute distress.  Neuro: Alert and oriented x3, extra-ocular muscles intact, sensation grossly intact.  HEENT: Normocephalic, atraumatic, pupils equal round reactive to light, neck supple, no masses, no lymphadenopathy, thyroid nonpalpable.  Skin: Warm and dry, no rashes. Cardiac: Regular rate and rhythm, no murmurs rubs or gallops, no lower extremity edema.  Respiratory: Clear to auscultation bilaterally. Not using accessory muscles, speaking in full sentences. Right elbow:  Swollen, tender olecranon bursa, no erythema, induration, warmth. Range of motion full pronation, supination, flexion, extension. Strength is full to all of the above directions Stable to varus, valgus stress. Negative moving valgus stress test. No discrete areas of tenderness to palpation. Ulnar nerve does not sublux. Negative cubital tunnel Tinel's.  Procedure: Real-time Ultrasound Guided aspiration/injection of right olecranon bursa Device: GE Logiq E  Verbal informed consent obtained.  Time-out conducted.  Noted no overlying erythema, induration, or other signs of local infection.  Skin prepped in a sterile  fashion.  Local anesthesia: Topical Ethyl chloride.  With sterile technique and under real time ultrasound guidance:  Using an 18-gauge needle I aspirated 7 cc of serosanguineous fluid, syringe switched and 1 cc Kenalog 40 injected easily Completed without difficulty  Pain immediately resolved suggesting accurate placement of the medication.  Advised to call if fevers/chills, erythema, induration, drainage, or persistent bleeding.  Images permanently stored and available for review in the ultrasound unit.  Impression: Technically successful ultrasound guided injection.  Impression and Recommendations:    Olecranon bursitis, right elbow Aspiration, injection, strapped with a compressive dressing, return in a week.  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 Referral to Dr. Everardo PacificVarkey to discuss reverse arthroplasty. Desires to avoid medications.  Annual physical exam Adding colon cancer screening. He did have a few benign polyps previously.   ___________________________________________ Ihor Austinhomas J. Benjamin Stainhekkekandam, M.D., ABFM., CAQSM. Primary Care and Sports Medicine [facility]  Adjunct Professor of Family Medicine  [employer]

## 2018-11-22 NOTE — Assessment & Plan Note (Signed)
Aspiration, injection, strapped with a compressive dressing, return in a week.

## 2018-11-22 NOTE — Assessment & Plan Note (Signed)
Adding colon cancer screening. He did have a few benign polyps previously.

## 2018-11-28 ENCOUNTER — Other Ambulatory Visit: Payer: Self-pay | Admitting: Orthopaedic Surgery

## 2018-11-28 DIAGNOSIS — M25511 Pain in right shoulder: Secondary | ICD-10-CM

## 2018-12-05 ENCOUNTER — Telehealth: Payer: Self-pay | Admitting: Sports Medicine

## 2018-12-05 DIAGNOSIS — Z1211 Encounter for screening for malignant neoplasm of colon: Secondary | ICD-10-CM

## 2018-12-05 DIAGNOSIS — M19011 Primary osteoarthritis, right shoulder: Secondary | ICD-10-CM

## 2018-12-05 NOTE — Telephone Encounter (Signed)
Referral sent out for Duane Clarke doesn't work due to lack of coverage by insurance. He requested to be sent out through [facility] for the surgery.   Please advise.

## 2018-12-06 ENCOUNTER — Other Ambulatory Visit: Payer: Medicare Other

## 2018-12-11 NOTE — Addendum Note (Signed)
Addended by: Silverio Decamp on: 12/11/2018 11:01 AM   Modules accepted: Orders

## 2018-12-11 NOTE — Telephone Encounter (Signed)
Patient needs a new referral for Ortho. [facility] is not in network with his insurance. He will need to go to [facility].

## 2018-12-11 NOTE — Addendum Note (Signed)
Addended by: Narda Rutherford on: 12/11/2018 09:30 AM   Modules accepted: Orders

## 2018-12-11 NOTE — Telephone Encounter (Signed)
Done

## 2018-12-20 ENCOUNTER — Ambulatory Visit: Payer: Medicare Other | Admitting: Sports Medicine

## 2019-01-03 ENCOUNTER — Inpatient Hospital Stay: Admit: 2019-01-03 | Payer: Medicare Other | Admitting: Orthopaedic Surgery

## 2019-01-03 SURGERY — ARTHROPLASTY, SHOULDER, TOTAL, REVERSE
Anesthesia: Choice | Laterality: Right

## 2019-01-17 LAB — COLOGUARD: Cologuard: POSITIVE — AB

## 2019-01-19 ENCOUNTER — Telehealth: Payer: Self-pay | Admitting: Sports Medicine

## 2019-01-19 DIAGNOSIS — R195 Other fecal abnormalities: Secondary | ICD-10-CM | POA: Insufficient documentation

## 2019-01-19 NOTE — Telephone Encounter (Signed)
Noted positive Cologuard screening test, referral placed for diagnostic colonoscopy, please call patient to let him know his results were positive.

## 2019-01-19 NOTE — Assessment & Plan Note (Signed)
Referral for colonoscopy °

## 2019-01-22 NOTE — Telephone Encounter (Signed)
LMOM notifying pt of results and that GI would be contacting him for colonoscopy.

## 2019-01-22 NOTE — Telephone Encounter (Signed)
Left message for patient to call back  

## 2019-01-26 ENCOUNTER — Encounter: Payer: Self-pay | Admitting: Sports Medicine

## 2019-03-22 ENCOUNTER — Encounter: Payer: Self-pay | Admitting: Internal Medicine

## 2019-10-18 ENCOUNTER — Ambulatory Visit: Payer: Medicare Other | Admitting: Nurse Practitioner

## 2019-11-27 IMAGING — MR MRI OF THE RIGHT SHOULDER WITHOUT CONTRAST
5 series · 40 of 40 positions shown · non-contrast
Comparison: Right shoulder x-rays dated June 12, 2018.

CLINICAL DATA: Chronic right shoulder pain and limited range of
motion.

EXAM:
MRI OF THE RIGHT SHOULDER WITHOUT CONTRAST
TECHNIQUE: Multiplanar, multisequence MR imaging of the shoulder was performed.
No intravenous contrast was administered.

[Series 3: PD fat-sat · axial · 4.0mm · 0.59mm/px · z∈[-32,+64]mm · 8 of 23 slices shown (1 of 2)]
[im 1/23]
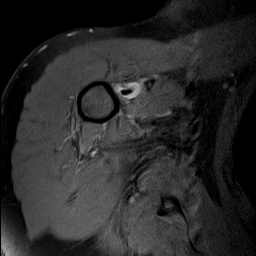
[im 4/23]
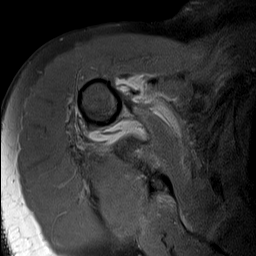
[im 7/23]
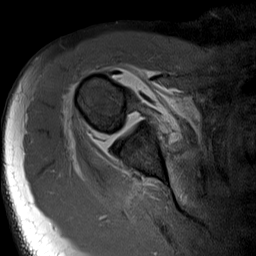
[im 10/23]
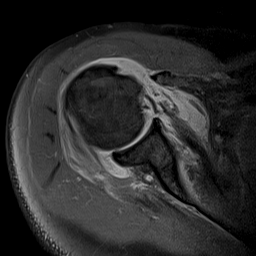
[im 13/23]
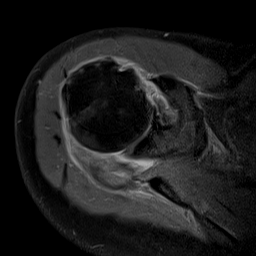
[im 16/23]
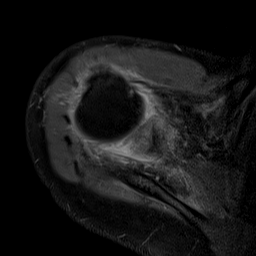
[im 19/23]
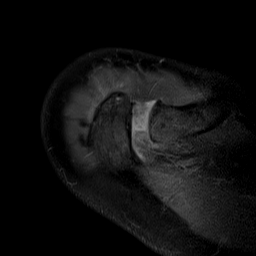
[im 23/23]
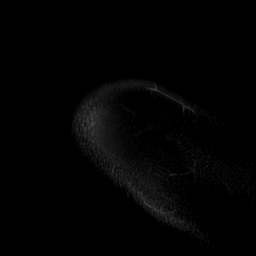

[Series 4: T2 fat-sat · oblique · 4.0mm · 0.59mm/px · 7 of 21 slices shown (1 of 2)]
[im 1/21]
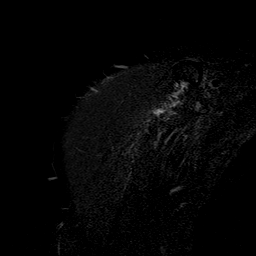
[im 4/21]
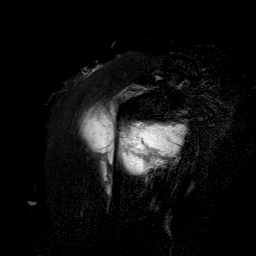
[im 7/21]
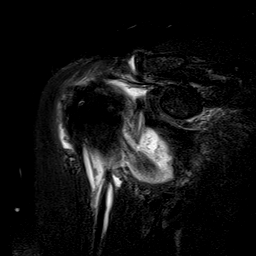
[im 11/21]
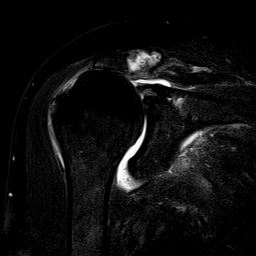
[im 14/21]
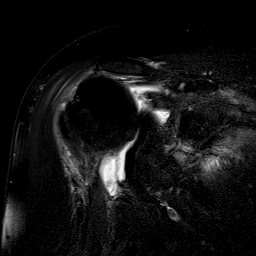
[im 17/21]
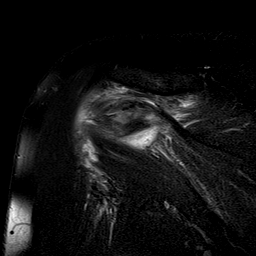
[im 21/21]
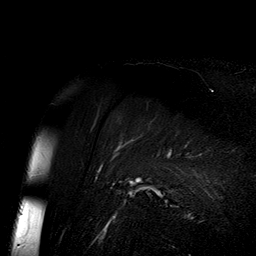

[Series 5: PD fat-sat · oblique · 4.0mm · 0.59mm/px · 7 of 21 slices shown (2 of 2)]
[im 1/21]
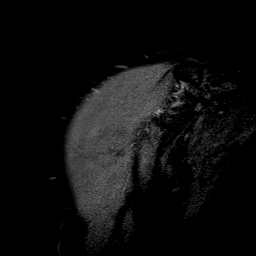
[im 4/21]
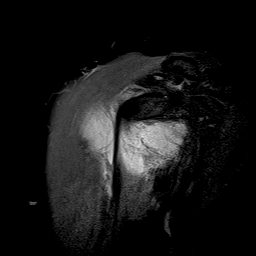
[im 7/21]
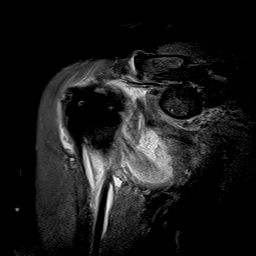
[im 11/21]
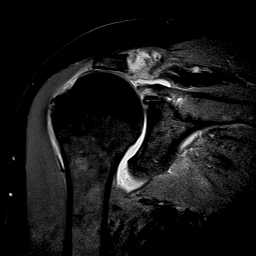
[im 14/21]
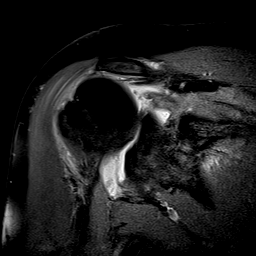
[im 17/21]
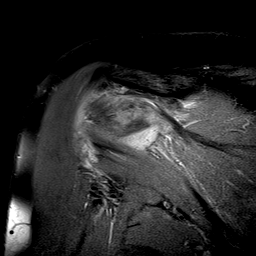
[im 21/21]
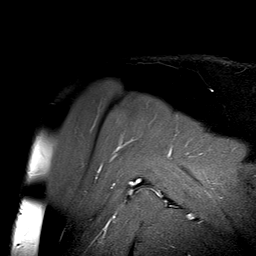

[Series 6: T1 · oblique · 4.0mm · 0.59mm/px · 9 of 26 slices shown]
[im 1/26]
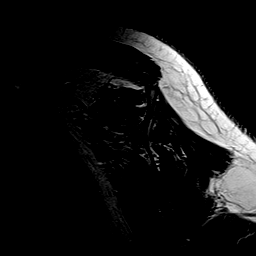
[im 4/26]
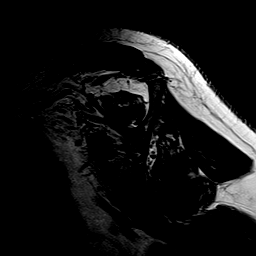
[im 7/26]
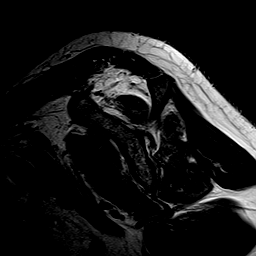
[im 10/26]
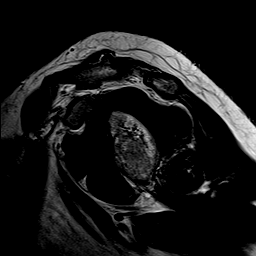
[im 13/26]
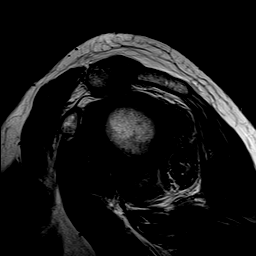
[im 16/26]
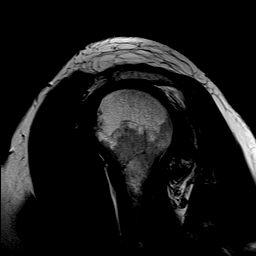
[im 19/26]
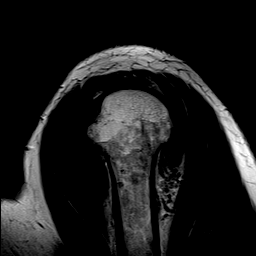
[im 22/26]
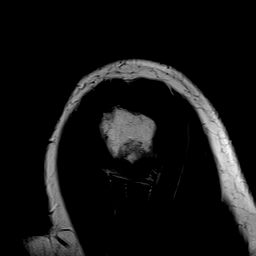
[im 26/26]
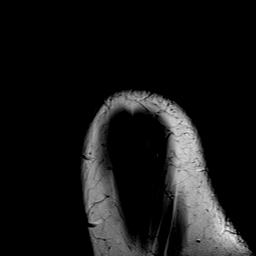

[Series 8: T2 fat-sat · oblique · 4.0mm · 0.59mm/px · 9 of 26 slices shown (2 of 2)]
[im 1/26]
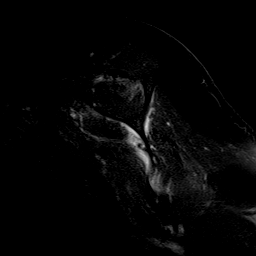
[im 4/26]
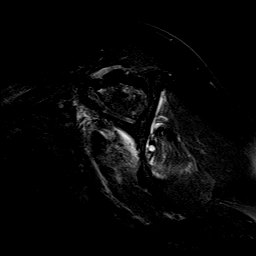
[im 7/26]
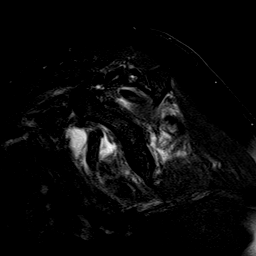
[im 10/26]
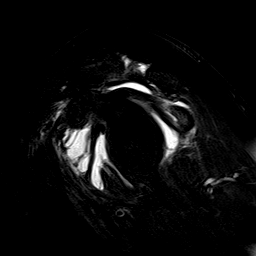
[im 13/26]
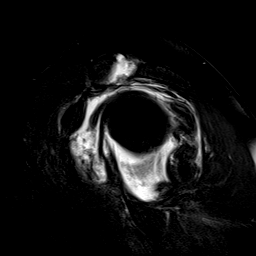
[im 16/26]
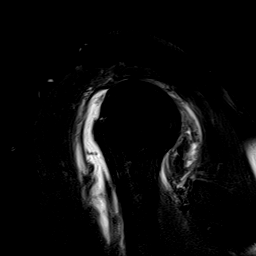
[im 19/26]
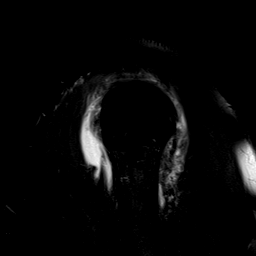
[im 22/26]
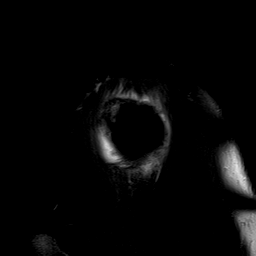
[im 26/26]
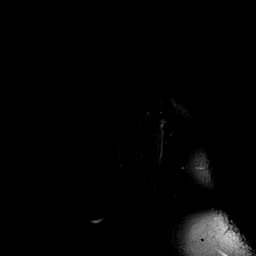

[40 of 40 positions shown; findings below may reference images not displayed]

FINDINGS: Rotator cuff: Full-thickness, full width tears of the supraspinatus,
infraspinatus, and subscapularis tendons with retraction to the
glenoid. The teres minor tendon is intact.

Muscles:  Mild subscapularis and infraspinatus fatty infiltration.

Biceps long head:  Intact but medially dislocated.

Acromioclavicular Joint: Mild widening of the acromioclavicular
interval with chronically torn superior inferior acromioclavicular
ligaments. Type II acromion. Small subacromial/subdeltoid bursal
fluid.

Glenohumeral Joint: Mild diffuse cartilage thinning without focal
defect. Moderate joint effusion with synovitis.

Labrum:  Suspected posterosuperior labral tear.

Bones: High-riding humeral head. No acute fracture or dislocation.
No focal bone lesion.

Other: None.
IMPRESSION: 1. Massive rotator cuff tear with full-thickness, full width tears
of the supraspinatus, infraspinatus, and subscapularis tendons.
2. Mild subscapularis and infraspinatus fatty infiltration.
3. Medial dislocation of the biceps tendon.
4. Suspected posterosuperior labral tear.
5. Chronic acromioclavicular joint separation.

## 2020-02-21 ENCOUNTER — Telehealth: Payer: Self-pay

## 2020-02-21 DIAGNOSIS — Z20822 Contact with and (suspected) exposure to covid-19: Secondary | ICD-10-CM

## 2020-02-21 NOTE — Telephone Encounter (Signed)
Ordered Covid test.

## 2020-02-24 LAB — SPECIMEN STATUS REPORT

## 2020-02-24 LAB — SARS-COV-2, NAA 2 DAY TAT

## 2020-02-24 LAB — NOVEL CORONAVIRUS, NAA: SARS-CoV-2, NAA: NOT DETECTED

## 2020-05-08 ENCOUNTER — Telehealth: Payer: Self-pay | Admitting: Sports Medicine

## 2020-05-08 DIAGNOSIS — Z0000 Encounter for general adult medical examination without abnormal findings: Secondary | ICD-10-CM

## 2020-05-08 DIAGNOSIS — N401 Enlarged prostate with lower urinary tract symptoms: Secondary | ICD-10-CM

## 2020-05-08 NOTE — Telephone Encounter (Signed)
Mr. Hollyfield called. He has scheduled an appointment for physical on  November 17th and would like a lab Order sent down on November 15th.  Thank you.

## 2020-05-09 NOTE — Telephone Encounter (Signed)
Orders placed.

## 2020-05-09 NOTE — Telephone Encounter (Signed)
Left message on VM that orders had been placed.

## 2020-05-14 ENCOUNTER — Ambulatory Visit: Payer: Medicare Other | Admitting: Sports Medicine

## 2020-11-03 ENCOUNTER — Other Ambulatory Visit: Payer: Self-pay

## 2020-11-03 ENCOUNTER — Telehealth (INDEPENDENT_AMBULATORY_CARE_PROVIDER_SITE_OTHER): Payer: Medicare (Managed Care) | Admitting: Sports Medicine

## 2020-11-03 ENCOUNTER — Encounter: Payer: Self-pay | Admitting: Sports Medicine

## 2020-11-03 DIAGNOSIS — J0100 Acute maxillary sinusitis, unspecified: Secondary | ICD-10-CM | POA: Diagnosis not present

## 2020-11-03 MED ORDER — AMOXICILLIN-POT CLAVULANATE 875-125 MG PO TABS: 1.0000 | ORAL_TABLET | Freq: Two times a day (BID) | ORAL | 0 refills | Status: AC

## 2020-11-03 MED ORDER — PREDNISONE 50 MG PO TABS: 50.0000 mg | ORAL_TABLET | Freq: Every day | ORAL | 0 refills | Status: AC

## 2020-11-03 NOTE — Assessment & Plan Note (Signed)
Several days of facial pain and pressure, sinus drainage, no other symptoms. Suspect acute maxillary sinusitis, he did feel a little bit better today but this is likely just the start of the double sickening. Adding Augmentin, prednisone, return to see me if no better in a couple of weeks.

## 2020-11-03 NOTE — Progress Notes (Signed)
Yellow nasal congestion yesterday, clearer today No fever Sinus pressure under eyes No ear pain but feels like there's fluid in there Sinus drainage  Symptoms worsening over last 2-3 days

## 2020-11-03 NOTE — Progress Notes (Signed)
   Virtual Visit via Telephone   I connected with  Duane Clarke  on 11/03/20 by telephone/telehealth and verified that I am speaking with the correct person using two identifiers.   I discussed the limitations, risks, security and privacy concerns of performing an evaluation and management service by telephone, including the higher likelihood of inaccurate diagnosis and treatment, and the availability of in person appointments.  We also discussed the likely need of an additional face to face encounter for complete and high quality delivery of care.  I also discussed with the patient that there may be a patient responsible charge related to this service. The patient expressed understanding and wishes to proceed.  Provider location is in medical facility. Patient location is at their home, different from provider location. People involved in care of the patient during this telehealth encounter were myself, my nurse/medical assistant, and my front office/scheduling team member.  Review of Systems: No fevers, chills, night sweats, weight loss, chest pain, or shortness of breath.   Objective Findings:    General: Speaking full sentences, no audible heavy breathing.  Sounds alert and appropriately interactive.    Independent interpretation of tests performed by another provider:   None.  Brief History, Exam, Impression, and Recommendations:    Acute non-recurrent maxillary sinusitis Several days of facial pain and pressure, sinus drainage, no other symptoms. Suspect acute maxillary sinusitis, he did feel a little bit better today but this is likely just the start of the double sickening. Adding Augmentin, prednisone, return to see me if no better in a couple of weeks.   I discussed the above assessment and treatment plan with the patient. The patient was provided an opportunity to ask questions and all were answered. The patient agreed with the plan and demonstrated an understanding of the  instructions.   The patient was advised to call back or seek an in-person evaluation if the symptoms worsen or if the condition fails to improve as anticipated.   I provided 30 minutes of verbal and non-verbal time during this encounter date, time was needed to gather information, review chart, records, communicate/coordinate with staff remotely, as well as complete documentation.  Specifically we talked about the natural history of sinus infections, the double sickening and the treatment protocol.   ___________________________________________ [name]. Benjamin Stain, M.D., ABFM., CAQSM. Primary Care and Sports Medicine [facility]  Adjunct Professor of Family Medicine  [employer]

## 2020-11-13 ENCOUNTER — Ambulatory Visit (INDEPENDENT_AMBULATORY_CARE_PROVIDER_SITE_OTHER): Payer: Medicare (Managed Care)

## 2020-11-13 ENCOUNTER — Encounter: Payer: Self-pay | Admitting: Sports Medicine

## 2020-11-13 ENCOUNTER — Ambulatory Visit (INDEPENDENT_AMBULATORY_CARE_PROVIDER_SITE_OTHER): Payer: Medicare (Managed Care) | Admitting: Sports Medicine

## 2020-11-13 ENCOUNTER — Other Ambulatory Visit: Payer: Self-pay

## 2020-11-13 VITALS — BP 121/76 | HR 57 | Temp 98.8°F | Resp 20 | Ht 68.0 in | Wt 187.0 lb

## 2020-11-13 DIAGNOSIS — G8929 Other chronic pain: Secondary | ICD-10-CM

## 2020-11-13 DIAGNOSIS — R202 Paresthesia of skin: Secondary | ICD-10-CM | POA: Diagnosis not present

## 2020-11-13 DIAGNOSIS — N401 Enlarged prostate with lower urinary tract symptoms: Secondary | ICD-10-CM | POA: Diagnosis not present

## 2020-11-13 DIAGNOSIS — D531 Other megaloblastic anemias, not elsewhere classified: Secondary | ICD-10-CM | POA: Diagnosis not present

## 2020-11-13 DIAGNOSIS — R5383 Other fatigue: Secondary | ICD-10-CM

## 2020-11-13 DIAGNOSIS — M25571 Pain in right ankle and joints of right foot: Secondary | ICD-10-CM | POA: Diagnosis not present

## 2020-11-13 DIAGNOSIS — Z0000 Encounter for general adult medical examination without abnormal findings: Secondary | ICD-10-CM

## 2020-11-13 DIAGNOSIS — R200 Anesthesia of skin: Secondary | ICD-10-CM | POA: Diagnosis not present

## 2020-11-13 DIAGNOSIS — G250 Essential tremor: Secondary | ICD-10-CM

## 2020-11-13 MED ORDER — MELOXICAM 15 MG PO TABS
ORAL_TABLET | ORAL | 3 refills | Status: AC
Start: 2020-11-13 — End: ?

## 2020-11-13 MED ORDER — TETANUS-DIPHTH-ACELL PERTUSSIS 5-2.5-18.5 LF-MCG/0.5 IM SUSY: 0.5000 mL | PREFILLED_SYRINGE | Freq: Once | INTRAMUSCULAR | 0 refills | Status: AC

## 2020-11-13 NOTE — Assessment & Plan Note (Addendum)
Chronic pain in the right ankle with numbness and tingling laterally. This is likely related to lumbar spinal stenosis, we will get some ankle x-rays. Okay to add meloxicam as he does have osteoarthritis with aches and pains at multiple sites. This is a chronic process with exacerbation and pharmacologic management.

## 2020-11-13 NOTE — Assessment & Plan Note (Addendum)
Annual physical as above, checking routine labs. Tdap sent to pharmacy. Declines pneumococcal vaccination and declines Shingrix vaccination. He did have a suspicious lesion on the upper left ear, he has an appointment coming up with dermatology.

## 2020-11-13 NOTE — Assessment & Plan Note (Signed)
Mild tremor, only with intention, looks like a benign essential tremor, no further treatment needed.

## 2020-11-13 NOTE — Progress Notes (Addendum)
Subjective:    CC: Annual Physical Exam  HPI:  This patient is here for their annual physical  I reviewed the past medical history, family history, social history, surgical history, and allergies today and no changes were needed.  Please see the problem list section below in epic for further details.  Past Medical History: Past Medical History:  Diagnosis Date  . Shingles    Past Surgical History: Past Surgical History:  Procedure Laterality Date  . VASECTOMY     Social History: Social History   Socioeconomic History  . Marital status: Married    Spouse name: Not on file  . Number of children: Not on file  . Years of education: Not on file  . Highest education level: Not on file  Occupational History  . Not on file  Tobacco Use  . Smoking status: Former Games developer  . Smokeless tobacco: Never Used  . Tobacco comment: Quit in 1977  Substance and Sexual Activity  . Alcohol use: Never    Alcohol/week: 0.0 standard drinks  . Drug use: Never  . Sexual activity: Not on file  Other Topics Concern  . Not on file  Social History Narrative  . Not on file   Social Determinants of Health   Financial Resource Strain: Not on file  Food Insecurity: Not on file  Transportation Needs: Not on file  Physical Activity: Not on file  Stress: Not on file  Social Connections: Not on file   Family History: Family History  Problem Relation Age of Onset  . Cancer Father        prostate  . Alzheimer's disease Father   . Stroke Maternal Grandfather   . Stroke Paternal Grandfather    Allergies: Allergies  Allergen Reactions  . Aspirin Other (See Comments)    fatigue   Medications: See med rec.  Review of Systems: No headache, visual changes, nausea, vomiting, diarrhea, constipation, dizziness, abdominal pain, skin rash, fevers, chills, night sweats, swollen lymph nodes, weight loss, chest pain, body aches, joint swelling, muscle aches, shortness of breath, mood changes, visual or  auditory hallucinations.  Objective:    General: Well Developed, well nourished, and in no acute distress.  Neuro: Alert and oriented x3, extra-ocular muscles intact, sensation grossly intact. Cranial nerves II through XII are intact, motor, sensory, and coordinative functions are all intact. HEENT: Normocephalic, atraumatic, pupils equal round reactive to light, neck supple, no masses, no lymphadenopathy, thyroid nonpalpable. Oropharynx, nasopharynx, external ear canals are unremarkable. Skin: Warm and dry, no rashes noted.  Hyperpigmented macule noted at the top of the left ear. Cardiac: Regular rate and rhythm, no murmurs rubs or gallops.  Respiratory: Clear to auscultation bilaterally. Not using accessory muscles, speaking in full sentences.  Abdominal: Soft, nontender, nondistended, positive bowel sounds, no masses, no organomegaly.  Musculoskeletal: Shoulder, elbow, wrist, hip, knee, ankle stable, and with full range of motion.  Impression and Recommendations:    The patient was counselled, risk factors were discussed, anticipatory guidance given.  Annual physical exam Annual physical as above, checking routine labs. Tdap sent to pharmacy. Declines pneumococcal vaccination and declines Shingrix vaccination. He did have a suspicious lesion on the upper left ear, he has an appointment coming up with dermatology.  Right ankle pain Chronic pain in the right ankle with numbness and tingling laterally. This is likely related to lumbar spinal stenosis, we will get some ankle x-rays. Okay to add meloxicam as he does have osteoarthritis with aches and pains at multiple sites. This  is a chronic process with exacerbation and pharmacologic management.  Benign essential tremor Mild tremor, only with intention, looks like a benign essential tremor, no further treatment needed.  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 I am going to go ahead and recheck his B12 and folate levels.   ___________________________________________ Duane Clarke. Benjamin Stain, M.D., ABFM., CAQSM. Primary Care and Sports Medicine [facility]  Adjunct Professor of Family Medicine  [employer]

## 2020-11-14 LAB — COMPREHENSIVE METABOLIC PANEL
AG Ratio: 2.2 (calc) (ref 1.0–2.5)
ALT: 15 U/L (ref 9–46)
AST: 16 U/L (ref 10–35)
Albumin: 4.2 g/dL (ref 3.6–5.1)
Alkaline phosphatase (APISO): 57 U/L (ref 35–144)
BUN: 17 mg/dL (ref 7–25)
CO2: 26 mmol/L (ref 20–32)
Calcium: 9.2 mg/dL (ref 8.6–10.3)
Chloride: 108 mmol/L (ref 98–110)
Creat: 0.97 mg/dL (ref 0.70–1.18)
Globulin: 1.9 g/dL (calc) (ref 1.9–3.7)
Glucose, Bld: 98 mg/dL (ref 65–99)
Potassium: 4.7 mmol/L (ref 3.5–5.3)
Sodium: 140 mmol/L (ref 135–146)
Total Bilirubin: 2.5 mg/dL — ABNORMAL HIGH (ref 0.2–1.2)
Total Protein: 6.1 g/dL (ref 6.1–8.1)

## 2020-11-14 LAB — CBC
HCT: 35.9 % — ABNORMAL LOW (ref 38.5–50.0)
Hemoglobin: 12.2 g/dL — ABNORMAL LOW (ref 13.2–17.1)
MCH: 38 pg — ABNORMAL HIGH (ref 27.0–33.0)
MCHC: 34 g/dL (ref 32.0–36.0)
MCV: 111.8 fL — ABNORMAL HIGH (ref 80.0–100.0)
MPV: 11.6 fL (ref 7.5–12.5)
Platelets: 230 10*3/uL (ref 140–400)
RBC: 3.21 10*6/uL — ABNORMAL LOW (ref 4.20–5.80)
RDW: 10.2 % — ABNORMAL LOW (ref 11.0–15.0)
WBC: 6.3 10*3/uL (ref 3.8–10.8)

## 2020-11-14 LAB — LIPID PANEL
Cholesterol: 151 mg/dL (ref <200)
HDL: 38 mg/dL — ABNORMAL LOW (ref >=40)
LDL Cholesterol (Calc): 98 mg/dL (calc)
Non-HDL Cholesterol (Calc): 113 mg/dL (calc) (ref <130)
Total CHOL/HDL Ratio: 4 (calc) (ref <5.0)
Triglycerides: 68 mg/dL (ref <150)

## 2020-11-14 LAB — PSA, TOTAL AND FREE
PSA, % Free: 42 % (calc) (ref >25)
PSA, Free: 0.5 ng/mL
PSA, Total: 1.2 ng/mL (ref <=4.0)

## 2020-11-14 LAB — TSH: TSH: 1.44 mIU/L (ref 0.40–4.50)

## 2020-11-14 NOTE — Addendum Note (Signed)
Addended by: Monica Becton on: 11/14/2020 09:04 AM   Modules accepted: Orders

## 2020-11-14 NOTE — Assessment & Plan Note (Signed)
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 I am going to go ahead and recheck his B12 and folate levels.

## 2020-11-17 ENCOUNTER — Telehealth: Payer: Self-pay

## 2020-11-17 DIAGNOSIS — U071 COVID-19: Secondary | ICD-10-CM | POA: Insufficient documentation

## 2020-11-17 MED ORDER — PAXLOVID 20 X 150 MG & 10 X 100MG PO TBPK: 3.0000 | ORAL_TABLET | Freq: Two times a day (BID) | ORAL | 0 refills | Status: AC

## 2020-11-17 NOTE — Telephone Encounter (Signed)
Patient reports his fever started Friday night but his didn't test positive until Saturday morning. He is interested in getting the infusion like his wife got. Please advise.

## 2020-11-17 NOTE — Telephone Encounter (Signed)
Duane Clarke called and said that he tested positive for Covid on Saturday 5/21. Pt wants to know if there's anything you can send in for this. Please advise, thanks.

## 2020-11-17 NOTE — Telephone Encounter (Signed)
Patient has an appt with Dr. Karie Schwalbe on 11/18/2020 where he will go over infusion vs. Oral meds.

## 2020-11-17 NOTE — Telephone Encounter (Signed)
The infusion sounds like a bad idea, without contraindications to oral treatment and mild disease the oral antiviral would be a better bet than a monoclonal antibody infusion.

## 2020-11-17 NOTE — Telephone Encounter (Signed)
Notified pt of Rx. Vitual visit scheduled for 5/24.

## 2020-11-17 NOTE — Assessment & Plan Note (Signed)
Recently tested today for COVID-19, adding Paxlovid, he needs a virtual visit with me tomorrow to reassess symptoms.

## 2020-11-17 NOTE — Telephone Encounter (Signed)
Yes, I will add the antiviral Paxlovid, he needs to be set up for a virtual visit tomorrow.

## 2020-11-18 ENCOUNTER — Telehealth (INDEPENDENT_AMBULATORY_CARE_PROVIDER_SITE_OTHER): Payer: Medicare (Managed Care) | Admitting: Sports Medicine

## 2020-11-18 DIAGNOSIS — U071 COVID-19: Secondary | ICD-10-CM

## 2020-11-18 NOTE — Progress Notes (Signed)
   Virtual Visit via Telephone   I connected with  Duane Clarke  on 11/18/20 by telephone/telehealth and verified that I am speaking with the correct person using two identifiers.   I discussed the limitations, risks, security and privacy concerns of performing an evaluation and management service by telephone, including the higher likelihood of inaccurate diagnosis and treatment, and the availability of in person appointments.  We also discussed the likely need of an additional face to face encounter for complete and high quality delivery of care.  I also discussed with the patient that there may be a patient responsible charge related to this service. The patient expressed understanding and wishes to proceed.  Provider location is in medical facility. Patient location is at their home, different from provider location. People involved in care of the patient during this telehealth encounter were myself, my nurse/medical assistant, and my front office/scheduling team member.  Review of Systems: No fevers, chills, night sweats, weight loss, chest pain, or shortness of breath.   Objective Findings:    General: Speaking full sentences, no audible heavy breathing.  Sounds alert and appropriately interactive.    Independent interpretation of tests performed by another provider:   None.  Brief History, Exam, Impression, and Recommendations:    Duane Clarke is a very pleasant 75 year old male, on Saturday, 5 days ago he started to have some symptoms with a cough, malaise, he tested positive for Duane, we called in Paxlovid yesterday and he has started the medication, he feels okay, he speaking full sentences, he understands that he can use some over-the-counter cold and flu medications, he will need to isolate till tomorrow then wear mask other days, of note he is vaccinated and boosted. He can return to see me as needed.   I discussed the above assessment and treatment plan with the  patient. The patient was provided an opportunity to ask questions and all were answered. The patient agreed with the plan and demonstrated an understanding of the instructions.   The patient was advised to call back or seek an in-person evaluation if the symptoms worsen or if the condition fails to improve as anticipated.   I provided 30 minutes of verbal and non-verbal time during this encounter date, time was needed to gather information, review chart, records, communicate/coordinate with staff remotely, as well as complete documentation.  Specifically we talked about potential side effects from Paxlovid, we talked about over-the-counter medications for symptomatic relief and isolation/quarantine requirements.   ___________________________________________ Duane Clarke. Duane Clarke, M.D., ABFM., CAQSM. Primary Care and Sports Medicine [facility]  Adjunct Professor of Family Medicine  [employer]

## 2020-11-18 NOTE — Assessment & Plan Note (Signed)
Duane Clarke is a very pleasant 75 year old male, on Saturday, 5 days ago he started to have some symptoms with a cough, malaise, he tested positive for COVID-19, we called in Paxlovid yesterday and he has started the medication, he feels okay, he speaking full sentences, he understands that he can use some over-the-counter cold and flu medications, he will need to isolate till tomorrow then wear mask other days, of note he is vaccinated and boosted. He can return to see me as needed.

## 2020-12-10 ENCOUNTER — Ambulatory Visit: Payer: Medicare (Managed Care) | Admitting: Sports Medicine

## 2020-12-22 ENCOUNTER — Ambulatory Visit (INDEPENDENT_AMBULATORY_CARE_PROVIDER_SITE_OTHER): Payer: Medicare (Managed Care) | Admitting: Sports Medicine

## 2020-12-22 DIAGNOSIS — Z0000 Encounter for general adult medical examination without abnormal findings: Secondary | ICD-10-CM

## 2020-12-22 NOTE — Progress Notes (Signed)
MEDICARE ANNUAL WELLNESS VISIT  12/22/2020  Telephone Visit Disclaimer This Medicare AWV was conducted by telephone due to national recommendations for restrictions regarding the COVID-19 Pandemic (e.g. social distancing).  I verified, using two identifiers, that I am speaking with Duane Clarke or their authorized healthcare agent. I discussed the limitations, risks, security, and privacy concerns of performing an evaluation and management service by telephone and the potential availability of an in-person appointment in the future. The patient expressed understanding and agreed to proceed.  Location of Patient: Home Location of Provider (nurse):  Provider home  Subjective:    Duane Clarke is a 75 y.o. male patient of Thekkekandam, Duane Austin, MD who had a Medicare Annual Wellness Visit today via telephone. Duane Clarke is Retired and lives with their spouse, at their daughter's house in the basement. he has 2 children. he reports that he is socially active and does interact with friends/family regularly. he is moderately physically active and enjoys spending time with his grand children and doing projects with his children.  Patient Care Team: Monica Becton, MD as PCP - General (Family Medicine)  Advanced Directives 12/22/2020 03/05/2015  Does Patient Have a Medical Advance Directive? Yes No  Type of Estate agent of [place];Living will -  Does patient want to make changes to medical advance directive? No - Patient declined -  Copy of Healthcare Power of Attorney in Chart? No - copy requested -  Would patient like information on creating a medical advance directive? - No - patient declined information    Hospital Utilization Over the Past 12 Months: # of hospitalizations or ER visits: 0 # of surgeries: 0  Review of Systems    Patient reports that his overall health is unchanged compared to last year.  History obtained from chart review and the  patient  Patient Reported Readings (BP, Pulse, CBG, Weight, etc) none  Pain Assessment Pain : No/denies pain     Current Medications & Allergies (verified) Allergies as of 12/22/2020       Reactions   Aspirin Other (See Comments)   fatigue        Medication List        Accurate as of December 22, 2020  2:07 PM. If you have any questions, ask your nurse or doctor.          cetirizine 10 MG tablet Commonly known as: ZYRTEC Take 10 mg by mouth daily.   folic acid 1 MG tablet Commonly known as: FOLVITE TAKE 1 TABLET BY MOUTH EVERY DAY   meloxicam 15 MG tablet Commonly known as: MOBIC One tab PO qAM with a meal for 2 weeks, then daily prn pain.   multivitamin tablet Take 1 tablet by mouth daily.   predniSONE 50 MG tablet Commonly known as: DELTASONE Take 1 tablet (50 mg total) by mouth daily.   vitamin B-12 1000 MCG tablet Commonly known as: CYANOCOBALAMIN Take 1 tablet (1,000 mcg total) by mouth daily.        History (reviewed): Past Medical History:  Diagnosis Date   Shingles    Past Surgical History:  Procedure Laterality Date   VASECTOMY     Family History  Problem Relation Age of Onset   Cancer Father        prostate   Alzheimer's disease Father    Stroke Maternal Grandfather    Stroke Paternal Grandfather    Social History   Socioeconomic History   Marital status: Married  Spouse name: Duane Clarke S. Beavis   Number of children: 2   Years of education: 13   Highest education level: Some college, no degree  Occupational History    Comment: Retired  Tobacco Use   Smoking status: Former    Pack years: 0.00   Smokeless tobacco: Never   Tobacco comments:    Quit in 1977  Substance and Sexual Activity   Alcohol use: Never    Alcohol/week: 0.0 standard drinks   Drug use: Never   Sexual activity: Not on file  Other Topics Concern   Not on file  Social History Narrative   Lives with his wife. He enjoys taking care of the grandchildren  and enjoys working on cars.   Social Determinants of Health   Financial Resource Strain: Low Risk    Difficulty of Paying Living Expenses: Not hard at all  Food Insecurity: No Food Insecurity   Worried About Programme researcher, broadcasting/film/videounning Out of Food in the Last Year: Never true   Ran Out of Food in the Last Year: Never true  Transportation Needs: No Transportation Needs   Lack of Transportation (Medical): No   Lack of Transportation (Non-Medical): No  Physical Activity: Inactive   Days of Exercise per Week: 0 days   Minutes of Exercise per Session: 0 min  Stress: No Stress Concern Present   Feeling of Stress : Not at all  Social Connections: Moderately Integrated   Frequency of Communication with Friends and Family: More than three times a week   Frequency of Social Gatherings with Friends and Family: More than three times a week   Attends Religious Services: More than 4 times per year   Active Member of [employer] or Organizations: No   Attends BankerClub or Organization Meetings: Never   Marital Status: Married    Activities of Daily Living In your present state of health, do you have any difficulty performing the following activities: 12/22/2020 11/13/2020  Hearing? N N  Vision? N N  Difficulty concentrating or making decisions? N N  Walking or climbing stairs? N N  Dressing or bathing? N N  Doing errands, shopping? N N  Preparing Food and eating ? N -  Using the Toilet? N -  In the past six months, have you accidently leaked urine? N -  Do you have problems with loss of bowel control? N -  Managing your Medications? N -  Managing your Finances? N -  Housekeeping or managing your Housekeeping? N -  Some recent data might be hidden    Patient Education/ Literacy How often do you need to have someone help you when you read instructions, pamphlets, or other written materials from your doctor or pharmacy?: 1 - Never What is the last grade level you completed in school?: 12th grade and some  college  Exercise Current Exercise Habits: The patient has a physically strenuous job, but has no regular exercise apart from work., Exercise limited by: None identified  Diet Patient reports consuming 2 meals a day and 2-3 snack(s) a day Patient reports that his primary diet is: Regular Patient reports that she does have regular access to food.   Depression Screen PHQ 2/9 Scores 12/22/2020 11/13/2020 04/17/2018  PHQ - 2 Score 0 0 0     Fall Risk Fall Risk  12/22/2020 11/13/2020 03/05/2015  Falls in the past year? 0 0 No  Number falls in past yr: 0 0 -  Injury with Fall? 0 0 -  Risk for fall due to :  No Fall Risks - -  Follow up Falls evaluation completed Falls evaluation completed -     Objective:  Marquice M Handy seemed alert and oriented and he participated appropriately during our telephone visit.  Blood Pressure Weight BMI  BP Readings from Last 3 Encounters:  11/13/20 121/76  11/03/20 129/72  11/22/18 110/65   Wt Readings from Last 3 Encounters:  11/13/20 187 lb (84.8 kg)  11/22/18 182 lb (82.6 kg)  07/24/18 184 lb (83.5 kg)   BMI Readings from Last 1 Encounters:  11/13/20 28.43 kg/m    *Unable to obtain current vital signs, weight, and BMI due to telephone visit type  Hearing/Vision  Deylan did not seem to have difficulty with hearing/understanding during the telephone conversation Reports that he has had a formal eye exam by an eye care professional within the past year Reports that he has not had a formal hearing evaluation within the past year *Unable to fully assess hearing and vision during telephone visit type  Cognitive Function: 6CIT Screen 12/22/2020  What Year? 0 points  What month? 0 points  What time? 0 points  Count back from 20 0 points  Months in reverse 0 points  Repeat phrase 0 points  Total Score 0   (Normal:0-7, Significant for Dysfunction: >8)  Normal Cognitive Function Screening: Yes   Immunization & Health Maintenance  Record Immunization History  Administered Date(s) Administered   PFIZER(Purple Top)SARS-COV-2 Vaccination 09/03/2019, 09/24/2019   Tdap 06/28/2008    Health Maintenance  Topic Date Due   COVID-19 Vaccine (3 - Booster for Pfizer series) 01/07/2021 (Originally 02/24/2020)   Zoster Vaccines- Shingrix (1 of 2) 03/24/2021 (Originally 10/28/1996)   TETANUS/TDAP  12/22/2021 (Originally 06/28/2018)   COLONOSCOPY (Pts 45-27yrs Insurance coverage will need to be confirmed)  01/30/2029   Hepatitis C Screening  Completed   HPV VACCINES  Aged Out       Assessment  This is a routine wellness examination for Duane Clarke.  Health Maintenance: Due or Overdue There are no preventive care reminders to display for this patient.  Duane Clarke does not need a referral for Community Assistance: Care Management:   no Social Work:    no Prescription Assistance:  no Nutrition/Diabetes Education:  no   Plan:  Personalized Goals  Goals Addressed               This Visit's Progress     Patient Stated (pt-stated)        12/22/2020 AWV Goal: Exercise for General Health  Patient will verbalize understanding of the benefits of increased physical activity: Exercising regularly is important. It will improve your overall fitness, flexibility, and endurance. Regular exercise also will improve your overall health. It can help you control your weight, reduce stress, and improve your bone density. Over the next year, patient will increase physical activity as tolerated with a goal of at least 150 minutes of moderate physical activity per week.  You can tell that you are exercising at a moderate intensity if your heart starts beating faster and you start breathing faster but can still hold a conversation. Moderate-intensity exercise ideas include: Walking 1 mile (1.6 km) in about 15 minutes Biking Hiking Golfing Dancing Water aerobics Patient will verbalize understanding of everyday activities that  increase physical activity by providing examples like the following: Yard work, such as: Insurance underwriter Gardening Washing windows or floors Patient will be able  to explain general safety guidelines for exercising:  Before you start a new exercise program, talk with your health care provider. Do not exercise so much that you hurt yourself, feel dizzy, or get very short of breath. Wear comfortable clothes and wear shoes with good support. Drink plenty of water while you exercise to prevent dehydration or heat stroke. Work out until your breathing and your heartbeat get faster.         Personalized Health Maintenance & Screening Recommendations  Td vaccine Shingles vaccine  Lung Cancer Screening Recommended: no (Low Dose CT Chest recommended if Age 64-80 years, 30 pack-year currently smoking OR have quit w/in past 15 years) Hepatitis C Screening recommended: no HIV Screening recommended: no  Advanced Directives: Written information was not prepared per patient's request.  Referrals & Orders No orders of the defined types were placed in this encounter.   Follow-up Plan Follow-up with Monica Becton, MD as planned Schedule your tetanus shot and your shingles shot at your pharmacy. Medicare wellness visit in one year AVS printed and mailed.   I have personally reviewed and noted the following in the patient's chart:   Medical and social history Use of alcohol, tobacco or illicit drugs  Current medications and supplements Functional ability and status Nutritional status Physical activity Advanced directives List of other physicians Hospitalizations, surgeries, and ER visits in previous 12 months Vitals Screenings to include cognitive, depression, and falls Referrals and appointments  In addition, I have reviewed and discussed with Duane Clarke certain preventive protocols,  quality metrics, and best practice recommendations. A written personalized care plan for preventive services as well as general preventive health recommendations is available and can be mailed to the patient at his request.      Modesto Charon, RN  12/22/2020

## 2020-12-22 NOTE — Patient Instructions (Addendum)
  MEDICARE ANNUAL WELLNESS VISIT Health Maintenance Summary and Written Plan of Care  Mr. Duane Clarke ,  Thank you for allowing me to perform your Medicare Annual Wellness Visit and for your ongoing commitment to your health.   Health Maintenance & Immunization History Health Maintenance  Topic Date Due   COVID-19 Vaccine (3 - Booster for Pfizer series) 01/07/2021 (Originally 02/24/2020)   Zoster Vaccines- Shingrix (1 of 2) 03/24/2021 (Originally 10/28/1996)   TETANUS/TDAP  12/22/2021 (Originally 06/28/2018)   COLONOSCOPY (Pts 45-100yrs Insurance coverage will need to be confirmed)  01/30/2029   Hepatitis C Screening  Completed   HPV VACCINES  Aged Out   Immunization History  Administered Date(s) Administered   PFIZER(Purple Top)SARS-COV-2 Vaccination 09/03/2019, 09/24/2019   Tdap 06/28/2008    These are the patient goals that we discussed:  Goals Addressed               This Visit's Progress     Patient Stated (pt-stated)        12/22/2020 AWV Goal: Exercise for General Health  Patient will verbalize understanding of the benefits of increased physical activity: Exercising regularly is important. It will improve your overall fitness, flexibility, and endurance. Regular exercise also will improve your overall health. It can help you control your weight, reduce stress, and improve your bone density. Over the next year, patient will increase physical activity as tolerated with a goal of at least 150 minutes of moderate physical activity per week.  You can tell that you are exercising at a moderate intensity if your heart starts beating faster and you start breathing faster but can still hold a conversation. Moderate-intensity exercise ideas include: Walking 1 mile (1.6 km) in about 15 minutes Biking Hiking Golfing Dancing Water aerobics Patient will verbalize understanding of everyday activities that increase physical activity by providing examples like the following: Yard work, such  as: Insurance underwriter Gardening Washing windows or floors Patient will be able to explain general safety guidelines for exercising:  Before you start a new exercise program, talk with your health care provider. Do not exercise so much that you hurt yourself, feel dizzy, or get very short of breath. Wear comfortable clothes and wear shoes with good support. Drink plenty of water while you exercise to prevent dehydration or heat stroke. Work out until your breathing and your heartbeat get faster.           This is a list of Health Maintenance Items that are overdue or due now: Td vaccine Shingles vaccine  Orders/Referrals Placed Today: No orders of the defined types were placed in this encounter.  (Contact our referral department at [phone] if you have not spoken with someone about your referral appointment within the next 5 days)    Follow-up Plan Follow-up with Monica Becton, MD as planned Schedule your tetanus shot and your shingles shot at your pharmacy. Medicare wellness visit in one year AVS printed and mailed.

## 2020-12-23 ENCOUNTER — Ambulatory Visit: Payer: Medicare (Managed Care)

## 2021-02-03 ENCOUNTER — Telehealth: Payer: Self-pay | Admitting: Sports Medicine

## 2021-02-03 NOTE — Telephone Encounter (Signed)
Duane Clarke called.  They need death certificate signed for [name]. Should you need to contact her, her number is [phone]. Thank you

## 2021-02-05 ENCOUNTER — Telehealth: Payer: Self-pay | Admitting: Sports Medicine

## 2021-02-05 NOTE — Telephone Encounter (Signed)
Duane Clarke  with [employer] called.   They're waiting on certification to proceed with cremation. Telephone # [phone].  Thank you.

## 2021-02-05 NOTE — Telephone Encounter (Signed)
Chong Sicilian calling from [employer] and [employer] called wanting to see if you completed the death certificate for this patient. They can not proceed with the cremation without that. Please call. [phone].

## 2021-02-06 NOTE — Telephone Encounter (Signed)
Death certificate filled out today

## 2021-02-26 DEATH — deceased

## 2022-01-15 IMAGING — DX DG ANKLE COMPLETE 3+V*R*
3 series · 3 of 3 positions shown · non-contrast
Comparison: None.

CLINICAL DATA: Pain at sinus tarsi. Chronic right ankle pain.
Numbness and tingling.

EXAM:
RIGHT ANKLE - COMPLETE 3+ VIEW

[ankle ap]
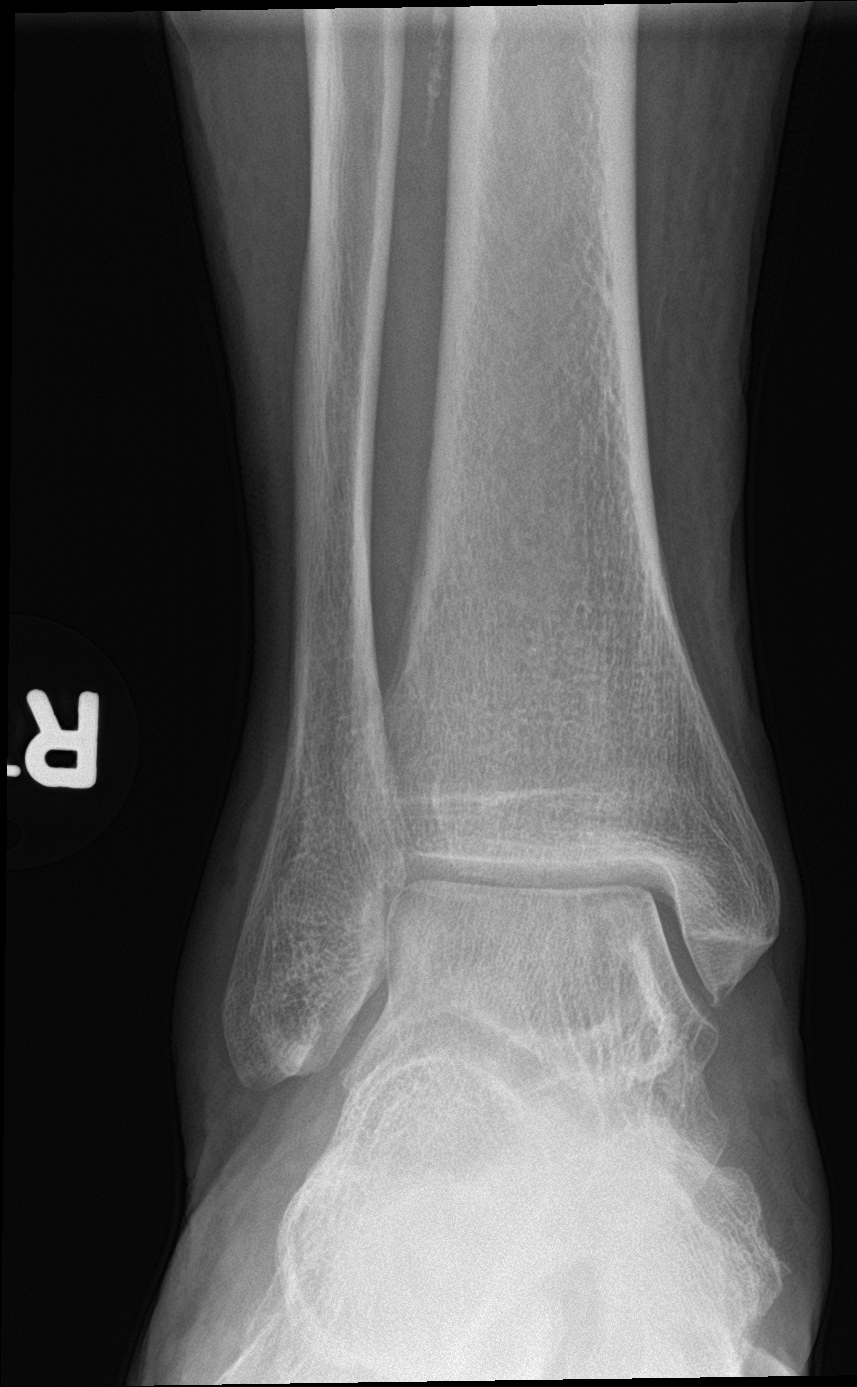

[ankle obl]
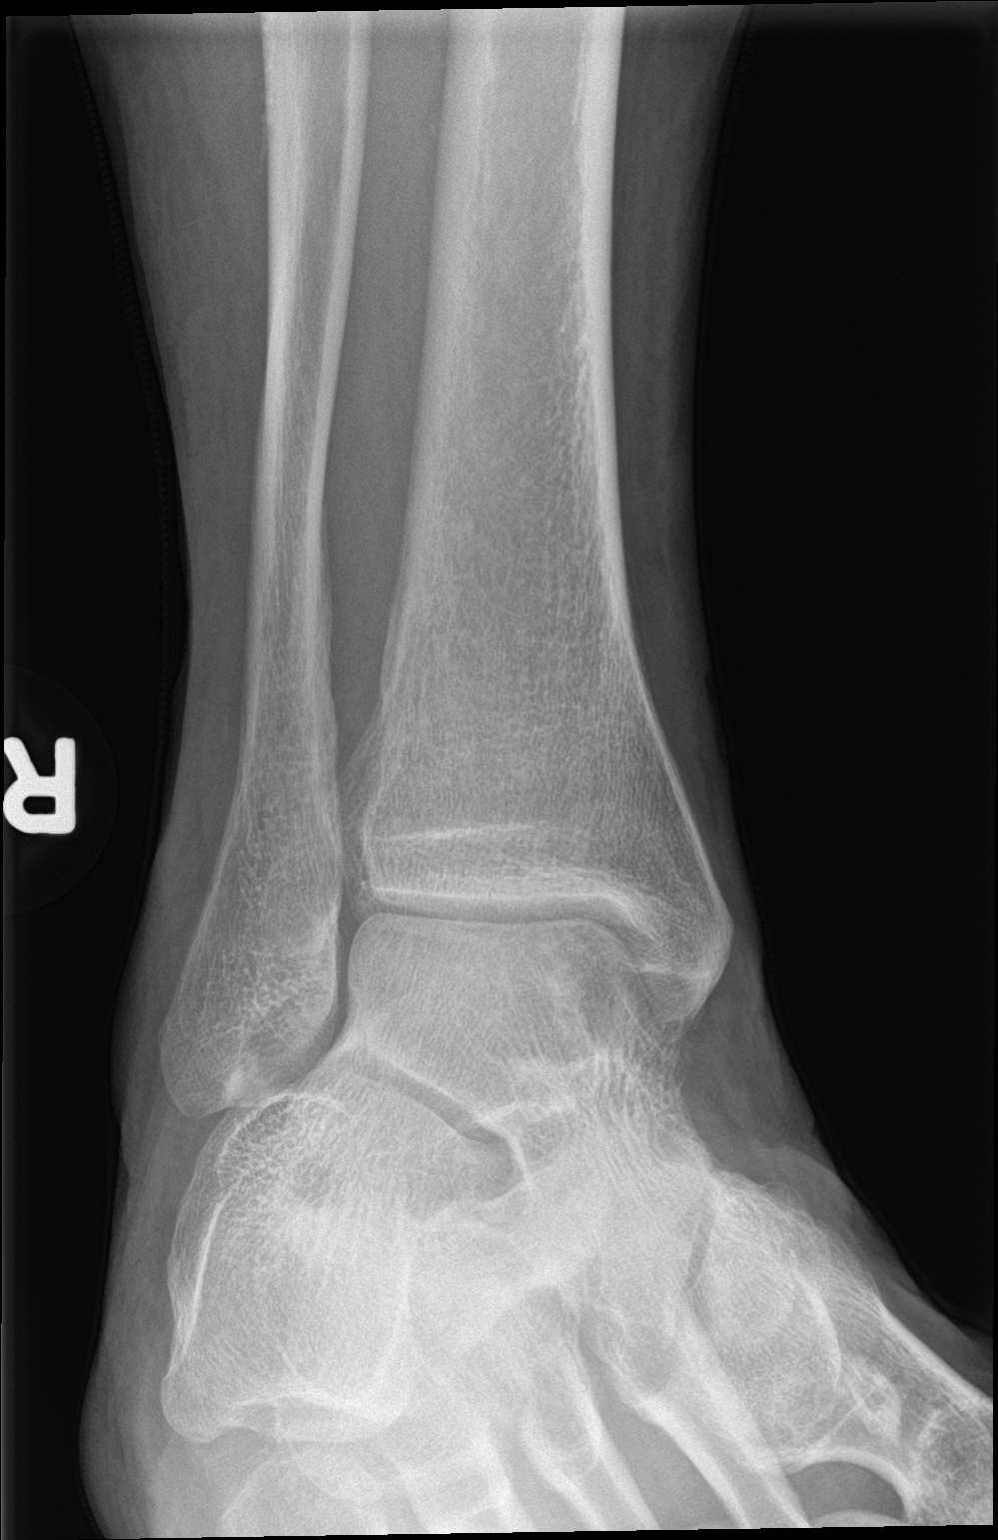

[ankle lat]
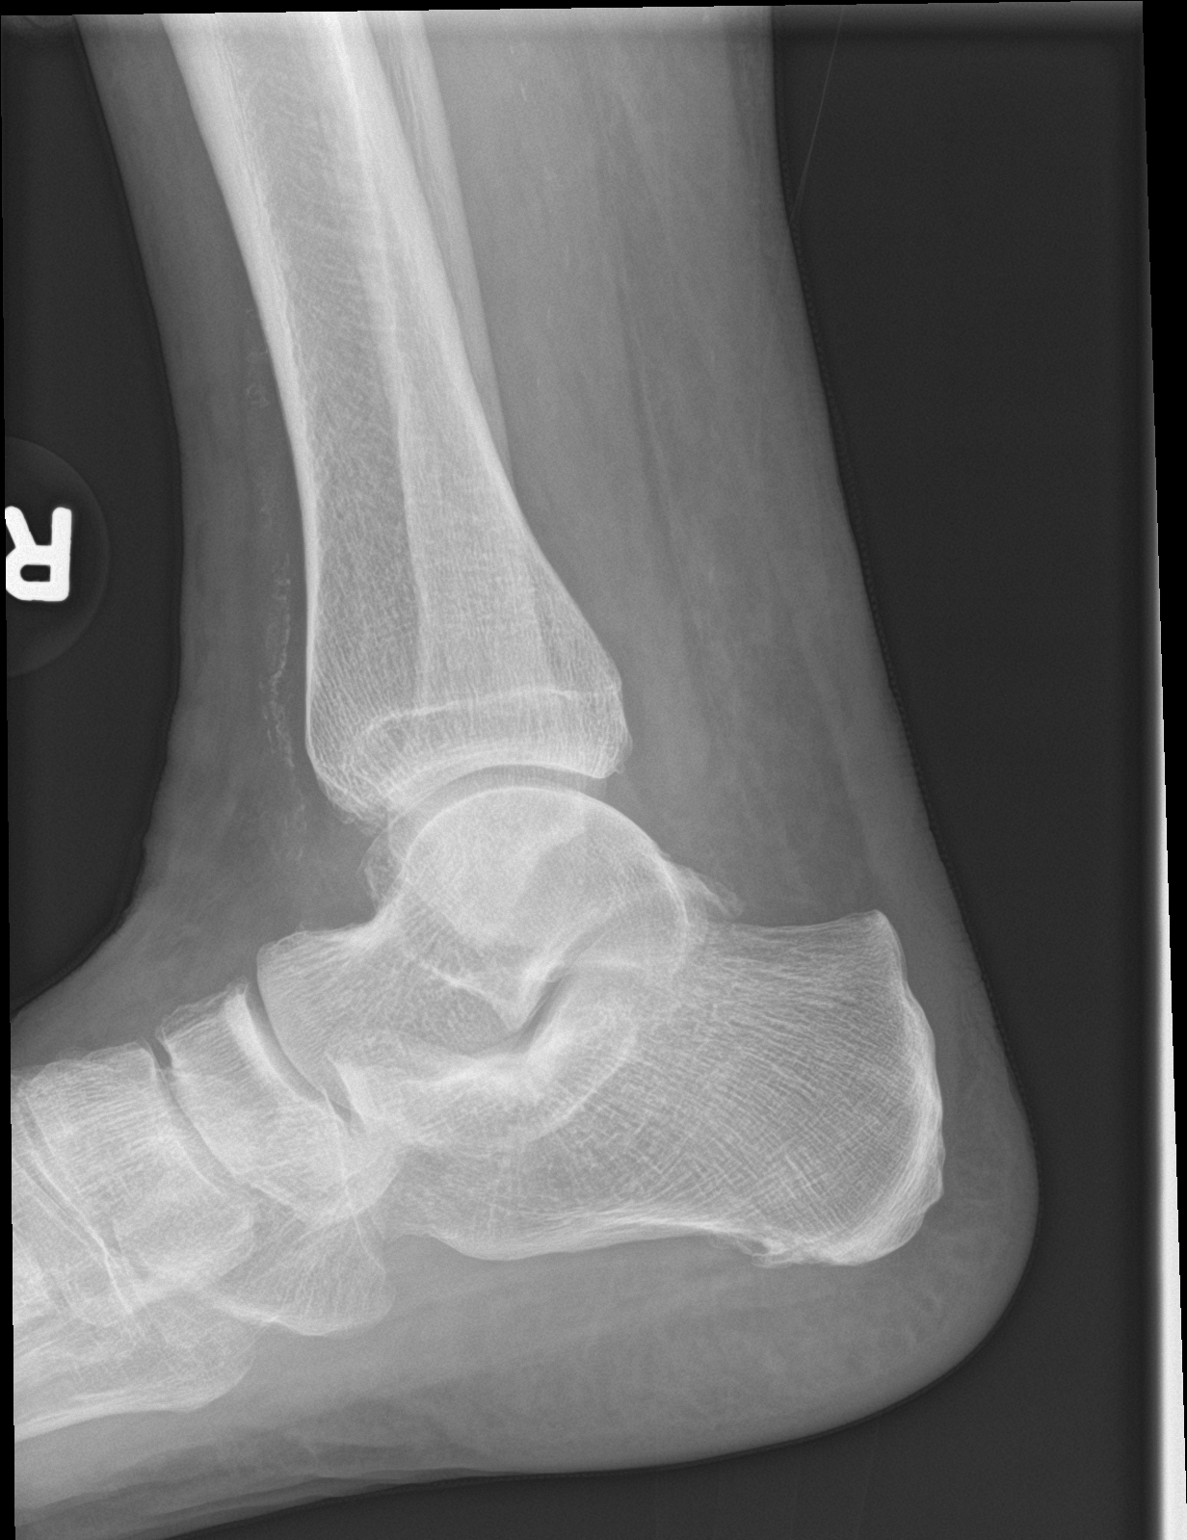

[3 of 3 positions shown; findings below may reference images not displayed]

FINDINGS: There is no evidence of fracture, dislocation, or joint effusion.
The ankle mortise is preserved. Questionable not definite
osteochondral lesion of the medial talar dome with vague decreased
bone density. Subtalar joint is unremarkable. Minimal talonavicular
degenerative change. Small plantar calcaneal spur. Vascular
calcifications are seen. No erosion or bone destruction. Soft
tissues are unremarkable.
IMPRESSION: 1. Questionable but not definite osteochondral lesion of the medial
talar dome.
2. Minimal talonavicular degenerative change. Small plantar
calcaneal spur.
3. Vascular calcifications.
